# Patient Record
Sex: Male | Born: 1960 | Race: White | Hispanic: No | Marital: Married | Smoking: Never smoker
Health system: Southern US, Community
[De-identification: ages and names within clinical notes are randomized; demographics above are authoritative.]

## PROBLEM LIST (undated history)

## (undated) DIAGNOSIS — N400 Enlarged prostate without lower urinary tract symptoms: Secondary | ICD-10-CM

## (undated) DIAGNOSIS — R3912 Poor urinary stream: Secondary | ICD-10-CM

## (undated) DIAGNOSIS — E785 Hyperlipidemia, unspecified: Secondary | ICD-10-CM

## (undated) DIAGNOSIS — R972 Elevated prostate specific antigen [PSA]: Secondary | ICD-10-CM

## (undated) DIAGNOSIS — R3914 Feeling of incomplete bladder emptying: Secondary | ICD-10-CM

## (undated) DIAGNOSIS — I10 Essential (primary) hypertension: Secondary | ICD-10-CM

## (undated) DIAGNOSIS — N320 Bladder-neck obstruction: Secondary | ICD-10-CM

## (undated) DIAGNOSIS — E119 Type 2 diabetes mellitus without complications: Secondary | ICD-10-CM

## (undated) HISTORY — DX: Feeling of incomplete bladder emptying: R39.14

## (undated) HISTORY — DX: Elevated prostate specific antigen (PSA): R97.20

## (undated) HISTORY — DX: Poor urinary stream: R39.12

---

## 2015-08-05 HISTORY — PX: CIRCUMCISION: SUR203

## 2016-03-15 ENCOUNTER — Encounter (HOSPITAL_COMMUNITY): Payer: Self-pay | Admitting: Emergency Medicine

## 2016-03-15 ENCOUNTER — Emergency Department (HOSPITAL_COMMUNITY): Payer: 59

## 2016-03-15 ENCOUNTER — Emergency Department (HOSPITAL_COMMUNITY)
Admission: EM | Admit: 2016-03-15 | Discharge: 2016-03-15 | Disposition: A | Discharge status: Home / Self Care | Payer: 59 | Attending: Emergency Medicine | Admitting: Emergency Medicine

## 2016-03-15 DIAGNOSIS — E119 Type 2 diabetes mellitus without complications: Secondary | ICD-10-CM | POA: Diagnosis not present

## 2016-03-15 DIAGNOSIS — J069 Acute upper respiratory infection, unspecified: Secondary | ICD-10-CM | POA: Diagnosis not present

## 2016-03-15 DIAGNOSIS — J988 Other specified respiratory disorders: Secondary | ICD-10-CM

## 2016-03-15 DIAGNOSIS — R69 Illness, unspecified: Secondary | ICD-10-CM | POA: Diagnosis present

## 2016-03-15 DIAGNOSIS — B9789 Other viral agents as the cause of diseases classified elsewhere: Secondary | ICD-10-CM

## 2016-03-15 DIAGNOSIS — I10 Essential (primary) hypertension: Secondary | ICD-10-CM | POA: Insufficient documentation

## 2016-03-15 HISTORY — DX: Essential (primary) hypertension: I10

## 2016-03-15 LAB — URINALYSIS, ROUTINE W REFLEX MICROSCOPIC
BILIRUBIN URINE: NEGATIVE
Glucose, UA: >1000 mg/dL — AB
KETONES UR: 15 mg/dL — AB
Leukocytes, UA: NEGATIVE
NITRITE: NEGATIVE
Protein, ur: NEGATIVE mg/dL
SPECIFIC GRAVITY, URINE: 1.01 (ref 1.005–1.030)
pH: 6 (ref 5.0–8.0)

## 2016-03-15 LAB — BASIC METABOLIC PANEL
Anion gap: 6 (ref 5–15)
BUN: 18 mg/dL (ref 6–20)
CHLORIDE: 104 mmol/L (ref 101–111)
CO2: 21 mmol/L — ABNORMAL LOW (ref 22–32)
Calcium: 8.2 mg/dL — ABNORMAL LOW (ref 8.9–10.3)
Creatinine, Ser: 0.92 mg/dL (ref 0.61–1.24)
GLUCOSE: 199 mg/dL — AB (ref 65–99)
POTASSIUM: 3.7 mmol/L (ref 3.5–5.1)
SODIUM: 131 mmol/L — AB (ref 135–145)

## 2016-03-15 LAB — CBC WITH DIFFERENTIAL/PLATELET
BASOS PCT: 0 %
Basophils Absolute: 0 10*3/uL (ref 0.0–0.1)
EOS ABS: 0 10*3/uL (ref 0.0–0.7)
EOS PCT: 0 %
HCT: 44.1 % (ref 39.0–52.0)
Hemoglobin: 15.1 g/dL (ref 13.0–17.0)
LYMPHS ABS: 0.6 10*3/uL — AB (ref 0.7–4.0)
Lymphocytes Relative: 10 %
MCH: 29 pg (ref 26.0–34.0)
MCHC: 34.2 g/dL (ref 30.0–36.0)
MCV: 84.8 fL (ref 78.0–100.0)
Monocytes Absolute: 0.5 10*3/uL (ref 0.1–1.0)
Monocytes Relative: 8 %
NEUTROS PCT: 83 %
Neutro Abs: 5 10*3/uL (ref 1.7–7.7)
PLATELETS: 109 10*3/uL — AB (ref 150–400)
RBC: 5.2 MIL/uL (ref 4.22–5.81)
RDW: 13.3 % (ref 11.5–15.5)
WBC: 6 10*3/uL (ref 4.0–10.5)

## 2016-03-15 LAB — LACTIC ACID, PLASMA
LACTIC ACID, VENOUS: 1.3 mmol/L (ref 0.5–1.9)
Lactic Acid, Venous: 2.2 mmol/L (ref 0.5–1.9)

## 2016-03-15 LAB — RAPID STREP SCREEN (MED CTR MEBANE ONLY): Streptococcus, Group A Screen (Direct): NEGATIVE

## 2016-03-15 LAB — URINE MICROSCOPIC-ADD ON
BACTERIA UA: NONE SEEN
WBC, UA: NONE SEEN WBC/hpf (ref 0–5)

## 2016-03-15 MED ORDER — SODIUM CHLORIDE 0.9 % IV SOLN
1000.0000 mL | INTRAVENOUS | Status: DC
  Administered 2016-03-15: 1000 mL via INTRAVENOUS

## 2016-03-15 MED ORDER — ACETAMINOPHEN 500 MG PO TABS
1000.0000 mg | ORAL_TABLET | Freq: Once | ORAL | Status: AC
  Administered 2016-03-15: 1000 mg via ORAL

## 2016-03-15 MED ORDER — SODIUM CHLORIDE 0.9 % IV SOLN
1000.0000 mL | Freq: Once | INTRAVENOUS | Status: AC
  Administered 2016-03-15: 1000 mL via INTRAVENOUS

## 2016-03-15 MED ORDER — ACETAMINOPHEN 500 MG PO TABS
ORAL_TABLET | ORAL | Status: AC
  Filled 2016-03-15: qty 2

## 2016-03-15 NOTE — ED Provider Notes (Signed)
[facility] DEPT Provider Note   CSN: [id number] Arrival date & time: 03/15/16  1643  First Provider Contact:  None       History   Chief Complaint Chief Complaint  Patient presents with  . Illness    HPI Bradley Hurst is a 55 y.o. male.  Patient is a 55 year old male who presents to the emergency department with a complaint of not feeling well.  The patient states he was in his usual state of good health until yesterday he began to not feel well. Today he continued to not feel well with body aches and fever. He went to an urgent care in [facility]. He was told that he had a lot of sugar in his urine. They did not find a urinary tract infection. The patient was advised to go to a local emergency department for additional testing and evaluation. Patient complains of some nasal congestion, sneezing, sore throat, mild cough. He denies any abdominal pain, back pain, hot joints, or open wounds that are not healing. He states that today he noticed a rash on his belly, but this seems to be resolving now. He's not had any recent injury. There's been no recent operations or procedures reported. Nothing seems to make his symptoms any worse. Tylenol helps him for a short time.   The history is provided by the patient.  Illness  Pertinent negatives include no chest pain, no abdominal pain and no shortness of breath.    Past Medical History:  Diagnosis Date  . Diabetes mellitus without complication ([place])   . Hypertension     There are no active problems to display for this patient.   History reviewed. No pertinent surgical history.     Home Medications    Prior to Admission medications   Not on File    Family History No family history on file.  Social History Social History  Substance Use Topics  . Smoking status: Never Smoker  . Smokeless tobacco: Never Used  . Alcohol use No     Allergies   Review of patient's allergies indicates no known  allergies.   Review of Systems Review of Systems  Constitutional: Positive for chills, fatigue and fever. Negative for activity change.       All ROS Neg except as noted in HPI  HENT: Positive for congestion and sore throat. Negative for nosebleeds and trouble swallowing.   Eyes: Negative for photophobia and discharge.  Respiratory: Positive for cough. Negative for shortness of breath and wheezing.   Cardiovascular: Negative for chest pain and palpitations.  Gastrointestinal: Negative for abdominal pain, blood in stool, diarrhea, nausea and vomiting.  Genitourinary: Positive for dysuria. Negative for frequency and hematuria.  Musculoskeletal: Positive for arthralgias and myalgias. Negative for back pain and neck pain.  Skin: Negative.   Neurological: Negative for dizziness, seizures and speech difficulty.  Psychiatric/Behavioral: Negative for confusion and hallucinations.     Physical Exam Updated Vital Signs BP 119/75 (BP Location: Left Arm)   Pulse 118   Temp 102.8 F (39.3 C) (Oral)   Resp 18   Ht 5\' 10"  (1.778 m)   Wt 131.1 kg   SpO2 96%   BMI 41.47 kg/m   Physical Exam  Constitutional: He is oriented to person, place, and time. He appears well-developed and well-nourished.  Non-toxic appearance.  HENT:  Head: Normocephalic.  Right Ear: Tympanic membrane and external ear normal.  Left Ear: Tympanic membrane and external ear normal.  Nasal congestion Mild increase redness of  the posterior pharynx.  Eyes: EOM and lids are normal. Pupils are equal, round, and reactive to light.  Neck: Normal range of motion. Neck supple. Carotid bruit is not present.  Cardiovascular: Regular rhythm, normal heart sounds, intact distal pulses and normal pulses.  Tachycardia present.   Pulmonary/Chest: Breath sounds normal. No respiratory distress.  Few scattered rhonchi at the bases. Normal respiratory effort. Symmetrical rise and fall of the chest. Patient speaks in complete sentences  without problem.  Abdominal: Soft. Bowel sounds are normal. There is no tenderness. There is no guarding.  Musculoskeletal: Normal range of motion. He exhibits edema.  2+ pitting edema of bilat lower extremities.  Lymphadenopathy:       Head (right side): No submandibular adenopathy present.       Head (left side): No submandibular adenopathy present.    He has no cervical adenopathy.  Neurological: He is alert and oriented to person, place, and time. He has normal strength. No cranial nerve deficit or sensory deficit.  Skin: Skin is warm and dry.  Psychiatric: He has a normal mood and affect. His speech is normal.  Nursing note and vitals reviewed.    ED Treatments / Results  Labs (all labs ordered are listed, but only abnormal results are displayed) Labs Reviewed  URINALYSIS, ROUTINE W REFLEX MICROSCOPIC (NOT AT [facility]) - Abnormal; Notable for the following:       Result Value   Glucose, UA >1000 (*)    Hgb urine dipstick TRACE (*)    Ketones, ur 15 (*)    All other components within normal limits  CBC WITH DIFFERENTIAL/PLATELET - Abnormal; Notable for the following:    Platelets 109 (*)    Lymphs Abs 0.6 (*)    All other components within normal limits  URINE MICROSCOPIC-ADD ON - Abnormal; Notable for the following:    Squamous Epithelial / LPF 0-5 (*)    All other components within normal limits  BASIC METABOLIC PANEL  LACTIC ACID, PLASMA    EKG  EKG Interpretation None       Radiology Dg Chest 2 View  Result Date: 03/15/2016 CLINICAL DATA:  Fever, malaise, SOB since Tuesday EXAM: CHEST  2 VIEW COMPARISON:  None. FINDINGS: The heart size and mediastinal contours are within normal limits. Both lungs are clear. No pleural effusion or pneumothorax. The visualized skeletal structures are unremarkable. IMPRESSION: No active cardiopulmonary disease. Electronically Signed   By: Lajean Manes M.D.   On: 03/15/2016 17:10    Procedures Procedures (including critical care  time)  Medications Ordered in ED Medications  acetaminophen (TYLENOL) 500 MG tablet (not administered)  0.9 %  sodium chloride infusion (not administered)    Followed by  0.9 %  sodium chloride infusion (not administered)  acetaminophen (TYLENOL) tablet 1,000 mg (1,000 mg Oral Given 03/15/16 1730)     Initial Impression / Assessment and Plan / ED Course  Temperature in the emergency department was 102.8. 1 g of Tylenol given. Hydration is elevated at 118. The patient is started on a liter of saline. Temperature down to 101.3 after Tylenol.  Case discussed in detail with Dr. Sabra Heck. Plan for second liter of fluids and observing in the emergency department. After second liter of saline, the temperature is down to 100.6.   I have reviewed the triage vital signs and the nursing notes.  Pertinent labs & imaging results that were available during my care of the patient were reviewed by me and considered in my medical decision making (  see chart for details).  Clinical Course    *I have reviewed nursing notes, vital signs, and all appropriate lab and imaging results for this patient.**  Final Clinical Impressions(s) / ED Diagnoses  Urinalysis shows a clear yellow specimen with a specific gravity 1.010. There is greater than 1000 mg/daL of glucose. There is a trace of hemoglobin present. This 15 mg/daL of ketones present. Nitrates and leukocyte esterase are negative. The basic metabolic panel shows the sodium to be slightly low at 131, CO2 is low at 21, and the glucose is elevated at 199. The complete blood count is within normal limits with exception of the platelets being slightly low at 109,000.  Lactic acid is elevated at 2.2. After second liter of IV fluid, the lactic acid is down to 1.3. The patient states he feels much improved. He is drinking here in the emergency department without problem.  Suspect a viral illness in the face of diabetes. The patient will be discharged home with  instructions to maintain good hydration. He will be asked to use Tylenol every 4 hours, or ibuprofen every 6 hours. He will be asked to return to the verge department after 4:00 on tomorrow August 13 for recheck on. He will return sooner than that if any unusual changes, problems, or concerns. The patient is in agreement with this discharge plan.    Final diagnoses:  None    New Prescriptions New Prescriptions   No medications on file     Lily Kocher, Hershal Coria 03/15/16 2053    Noemi Chapel, MD 03/16/16 [phone]

## 2016-03-15 NOTE — ED Notes (Signed)
Pt reports that he feels much better than when he arrived - He is conversant and reports that he has a goal with his physician to try to keep his Bld sugars in the 140s but does not always check his bld sugar. He has been experiencing increased stress due to a brother's illness and hospitalization in [place]

## 2016-03-15 NOTE — ED Notes (Signed)
Bradley Hurst notified of lactic acid 2.2

## 2016-03-15 NOTE — Discharge Instructions (Signed)
Please use Tylenol every 4 hours, or ibuprofen every 6 hours over the next 3 days. After that, use the Tylenol every 4 hours as needed for fever or aching. Please increase water, Gatorade, sugar-free popsicles, etc. to maintain good hydration. Please return to the emergency department after 4:00 on August 13 for recheck.

## 2016-03-15 NOTE — ED Triage Notes (Addendum)
Pt reports general malaise, cough,  rash on abdomen, and urinary frequency/dysuria since Wed. Pt had Motrin at 0800 this morning.

## 2016-03-16 ENCOUNTER — Inpatient Hospital Stay (HOSPITAL_COMMUNITY)
Admission: EM | Admit: 2016-03-16 | Discharge: 2016-03-20 | DRG: 872 | Disposition: A | Discharge status: Home / Self Care | Payer: 59 | Attending: Internal Medicine | Admitting: Internal Medicine

## 2016-03-16 ENCOUNTER — Encounter (HOSPITAL_COMMUNITY): Payer: Self-pay | Admitting: Emergency Medicine

## 2016-03-16 DIAGNOSIS — D696 Thrombocytopenia, unspecified: Secondary | ICD-10-CM

## 2016-03-16 DIAGNOSIS — R7989 Other specified abnormal findings of blood chemistry: Secondary | ICD-10-CM | POA: Diagnosis not present

## 2016-03-16 DIAGNOSIS — E871 Hypo-osmolality and hyponatremia: Secondary | ICD-10-CM | POA: Diagnosis present

## 2016-03-16 DIAGNOSIS — R945 Abnormal results of liver function studies: Secondary | ICD-10-CM

## 2016-03-16 DIAGNOSIS — D72810 Lymphocytopenia: Secondary | ICD-10-CM | POA: Diagnosis present

## 2016-03-16 DIAGNOSIS — R21 Rash and other nonspecific skin eruption: Secondary | ICD-10-CM | POA: Diagnosis present

## 2016-03-16 DIAGNOSIS — D72819 Decreased white blood cell count, unspecified: Secondary | ICD-10-CM | POA: Diagnosis present

## 2016-03-16 DIAGNOSIS — I10 Essential (primary) hypertension: Secondary | ICD-10-CM | POA: Diagnosis not present

## 2016-03-16 DIAGNOSIS — Z7984 Long term (current) use of oral hypoglycemic drugs: Secondary | ICD-10-CM

## 2016-03-16 DIAGNOSIS — E119 Type 2 diabetes mellitus without complications: Secondary | ICD-10-CM | POA: Diagnosis not present

## 2016-03-16 DIAGNOSIS — A419 Sepsis, unspecified organism: Secondary | ICD-10-CM | POA: Diagnosis not present

## 2016-03-16 DIAGNOSIS — Z79899 Other long term (current) drug therapy: Secondary | ICD-10-CM

## 2016-03-16 DIAGNOSIS — E860 Dehydration: Secondary | ICD-10-CM | POA: Diagnosis not present

## 2016-03-16 DIAGNOSIS — R509 Fever, unspecified: Secondary | ICD-10-CM

## 2016-03-16 DIAGNOSIS — E872 Acidosis: Secondary | ICD-10-CM | POA: Diagnosis not present

## 2016-03-16 LAB — CBC WITH DIFFERENTIAL/PLATELET
BASOS PCT: 0 %
Basophils Absolute: 0 10*3/uL (ref 0.0–0.1)
EOS ABS: 0 10*3/uL (ref 0.0–0.7)
EOS PCT: 0 %
HCT: 43.6 % (ref 39.0–52.0)
HEMOGLOBIN: 14.6 g/dL (ref 13.0–17.0)
Lymphocytes Relative: 8 %
Lymphs Abs: 0.3 10*3/uL — ABNORMAL LOW (ref 0.7–4.0)
MCH: 28.6 pg (ref 26.0–34.0)
MCHC: 33.5 g/dL (ref 30.0–36.0)
MCV: 85.5 fL (ref 78.0–100.0)
Monocytes Absolute: 0.2 10*3/uL (ref 0.1–1.0)
Monocytes Relative: 5 %
NEUTROS PCT: 86 %
Neutro Abs: 3.2 10*3/uL (ref 1.7–7.7)
PLATELETS: 86 10*3/uL — AB (ref 150–400)
RBC: 5.1 MIL/uL (ref 4.22–5.81)
RDW: 13.5 % (ref 11.5–15.5)
WBC: 3.7 10*3/uL — AB (ref 4.0–10.5)

## 2016-03-16 LAB — BASIC METABOLIC PANEL
Anion gap: 10 (ref 5–15)
BUN: 18 mg/dL (ref 6–20)
CHLORIDE: 103 mmol/L (ref 101–111)
CO2: 22 mmol/L (ref 22–32)
CREATININE: 0.99 mg/dL (ref 0.61–1.24)
Calcium: 8 mg/dL — ABNORMAL LOW (ref 8.9–10.3)
Glucose, Bld: 159 mg/dL — ABNORMAL HIGH (ref 65–99)
POTASSIUM: 3.7 mmol/L (ref 3.5–5.1)
SODIUM: 135 mmol/L (ref 135–145)

## 2016-03-16 LAB — HEPATIC FUNCTION PANEL
ALBUMIN: 3.4 g/dL — AB (ref 3.5–5.0)
ALK PHOS: 60 U/L (ref 38–126)
ALT: 96 U/L — ABNORMAL HIGH (ref 17–63)
AST: 98 U/L — ABNORMAL HIGH (ref 15–41)
BILIRUBIN INDIRECT: 1.5 mg/dL — AB (ref 0.3–0.9)
Bilirubin, Direct: 0.7 mg/dL — ABNORMAL HIGH (ref 0.1–0.5)
TOTAL PROTEIN: 6 g/dL — AB (ref 6.5–8.1)
Total Bilirubin: 2.2 mg/dL — ABNORMAL HIGH (ref 0.3–1.2)

## 2016-03-16 LAB — I-STAT CG4 LACTIC ACID, ED
LACTIC ACID, VENOUS: 2.19 mmol/L — AB (ref 0.5–1.9)
LACTIC ACID, VENOUS: 2.15 mmol/L — AB (ref 0.5–1.9)

## 2016-03-16 LAB — APTT: APTT: 37 s — AB (ref 24–36)

## 2016-03-16 LAB — PROTIME-INR
INR: 1.16
PROTHROMBIN TIME: 14.9 s (ref 11.4–15.2)

## 2016-03-16 MED ORDER — SODIUM CHLORIDE 0.9 % IV SOLN
INTRAVENOUS | Status: DC
  Administered 2016-03-16: 23:00:00 via INTRAVENOUS

## 2016-03-16 MED ORDER — VANCOMYCIN HCL IN DEXTROSE 1-5 GM/200ML-% IV SOLN: 1000.0000 mg | Freq: Once | INTRAVENOUS | Status: DC

## 2016-03-16 MED ORDER — PIPERACILLIN-TAZOBACTAM 3.375 G IVPB 30 MIN
3.3750 g | Freq: Once | INTRAVENOUS | Status: AC
  Administered 2016-03-16: 3.375 g via INTRAVENOUS
  Filled 2016-03-16 (×2): qty 50

## 2016-03-16 MED ORDER — ONDANSETRON HCL 4 MG/2ML IJ SOLN: 4.0000 mg | Freq: Four times a day (QID) | INTRAMUSCULAR | Status: DC | PRN

## 2016-03-16 MED ORDER — BISACODYL 5 MG PO TBEC: 5.0000 mg | DELAYED_RELEASE_TABLET | Freq: Every day | ORAL | Status: DC | PRN

## 2016-03-16 MED ORDER — ACETAMINOPHEN 500 MG PO TABS
1000.0000 mg | ORAL_TABLET | Freq: Once | ORAL | Status: AC
  Administered 2016-03-16: 1000 mg via ORAL
  Filled 2016-03-16: qty 2

## 2016-03-16 MED ORDER — PIPERACILLIN-TAZOBACTAM 3.375 G IVPB
3.3750 g | Freq: Three times a day (TID) | INTRAVENOUS | Status: DC
  Administered 2016-03-17 (×2): 3.375 g via INTRAVENOUS
  Filled 2016-03-16 (×2): qty 50

## 2016-03-16 MED ORDER — SODIUM CHLORIDE 0.9 % IV BOLUS (SEPSIS)
1000.0000 mL | Freq: Once | INTRAVENOUS | Status: AC
  Administered 2016-03-16: 1000 mL via INTRAVENOUS

## 2016-03-16 MED ORDER — IBUPROFEN 800 MG PO TABS
800.0000 mg | ORAL_TABLET | Freq: Once | ORAL | Status: AC
  Administered 2016-03-16: 800 mg via ORAL
  Filled 2016-03-16: qty 1

## 2016-03-16 MED ORDER — VANCOMYCIN HCL IN DEXTROSE 1-5 GM/200ML-% IV SOLN
2000.0000 mg | Freq: Once | INTRAVENOUS | Status: AC
  Administered 2016-03-16: 2000 mg via INTRAVENOUS
  Filled 2016-03-16: qty 400

## 2016-03-16 MED ORDER — DOXYCYCLINE HYCLATE 100 MG IV SOLR
200.0000 mg | Freq: Two times a day (BID) | INTRAVENOUS | Status: DC
  Administered 2016-03-17 – 2016-03-19 (×7): 200 mg via INTRAVENOUS
  Filled 2016-03-16 (×10): qty 200

## 2016-03-16 MED ORDER — HYDROCODONE-ACETAMINOPHEN 5-325 MG PO TABS
1.0000 | ORAL_TABLET | ORAL | Status: DC | PRN
  Administered 2016-03-18 – 2016-03-19 (×2): 2 via ORAL
  Filled 2016-03-16 (×2): qty 2

## 2016-03-16 MED ORDER — INSULIN ASPART 100 UNIT/ML ~~LOC~~ SOLN
0.0000 [IU] | Freq: Three times a day (TID) | SUBCUTANEOUS | Status: DC
  Administered 2016-03-17 – 2016-03-18 (×4): 3 [IU] via SUBCUTANEOUS
  Administered 2016-03-18: 2 [IU] via SUBCUTANEOUS
  Administered 2016-03-18: 5 [IU] via SUBCUTANEOUS
  Administered 2016-03-19: 3 [IU] via SUBCUTANEOUS
  Administered 2016-03-19: 5 [IU] via SUBCUTANEOUS
  Administered 2016-03-19 – 2016-03-20 (×2): 3 [IU] via SUBCUTANEOUS

## 2016-03-16 MED ORDER — OMEGA-3-ACID ETHYL ESTERS 1 G PO CAPS
1.0000 g | ORAL_CAPSULE | Freq: Two times a day (BID) | ORAL | Status: DC
  Administered 2016-03-16 – 2016-03-20 (×8): 1 g via ORAL
  Filled 2016-03-16 (×8): qty 1

## 2016-03-16 MED ORDER — POTASSIUM CHLORIDE CRYS ER 10 MEQ PO TBCR
10.0000 meq | EXTENDED_RELEASE_TABLET | Freq: Every day | ORAL | Status: DC
  Administered 2016-03-16 – 2016-03-19 (×4): 10 meq via ORAL
  Filled 2016-03-16 (×4): qty 1

## 2016-03-16 MED ORDER — ACETAMINOPHEN 325 MG PO TABS
650.0000 mg | ORAL_TABLET | Freq: Four times a day (QID) | ORAL | Status: DC | PRN
  Administered 2016-03-17 – 2016-03-20 (×3): 650 mg via ORAL
  Filled 2016-03-16 (×3): qty 2

## 2016-03-16 MED ORDER — ONDANSETRON HCL 4 MG PO TABS
4.0000 mg | ORAL_TABLET | Freq: Four times a day (QID) | ORAL | Status: DC | PRN
Start: 2016-03-16 — End: 2016-03-20

## 2016-03-16 MED ORDER — TAMSULOSIN HCL 0.4 MG PO CAPS
0.4000 mg | ORAL_CAPSULE | Freq: Two times a day (BID) | ORAL | Status: DC
  Administered 2016-03-16 – 2016-03-20 (×8): 0.4 mg via ORAL
  Filled 2016-03-16 (×8): qty 1

## 2016-03-16 MED ORDER — VANCOMYCIN HCL 10 G IV SOLR
1250.0000 mg | Freq: Three times a day (TID) | INTRAVENOUS | Status: DC
  Filled 2016-03-16: qty 1250

## 2016-03-16 MED ORDER — ACETAMINOPHEN 650 MG RE SUPP: 650.0000 mg | Freq: Four times a day (QID) | RECTAL | Status: DC | PRN

## 2016-03-16 MED ORDER — POLYETHYLENE GLYCOL 3350 17 G PO PACK: 17.0000 g | PACK | Freq: Every day | ORAL | Status: DC | PRN

## 2016-03-16 NOTE — ED Notes (Signed)
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

## 2016-03-16 NOTE — ED Provider Notes (Addendum)
[facility] DEPT Provider Note   CSN: [id number] Arrival date & time: 03/16/16  1623  First Provider Contact:  None       History   Chief Complaint Chief Complaint  Patient presents with  . Abnormal Lab    HPI Bradley Hurst is a 55 y.o. male.  Patient was evaluated here yesterday for dehydration, elevated lactate, general malaise. Strep test, chest x-ray, urinalysis all negative at that time. 3 L of IV fluids were given. Patient felt better at discharge and his lactate had decreased. He had sweats last night but felt better during the day today. Now he feels slightly chilled. No neurological deficits, stiff neck, cough, dysuria.  He says he feels better than yesterday.      Past Medical History:  Diagnosis Date  . Diabetes mellitus without complication ([place])   . Hypertension     There are no active problems to display for this patient.   History reviewed. No pertinent surgical history.     Home Medications    Prior to Admission medications   Not on File    Family History History reviewed. No pertinent family history.  Social History Social History  Substance Use Topics  . Smoking status: Never Smoker  . Smokeless tobacco: Never Used  . Alcohol use No     Allergies   Review of patient's allergies indicates no known allergies.   Review of Systems Review of Systems  All other systems reviewed and are negative.    Physical Exam Updated Vital Signs BP 129/64 (BP Location: Left Arm)   Pulse 102   Temp 98.7 F (37.1 C) (Oral)   Resp 16   Ht 5\' 10"  (1.778 m)   Wt 289 lb (131.1 kg)   SpO2 97%   BMI 41.47 kg/m   Physical Exam  Constitutional:  Obese, no acute distress  HENT:  Head: Normocephalic and atraumatic.  Eyes: Conjunctivae are normal.  Neck: Neck supple.  Cardiovascular: Normal rate and regular rhythm.   No murmur heard. Pulmonary/Chest: Effort normal and breath sounds normal. No respiratory distress.  Abdominal: Soft. There is  no tenderness.  Musculoskeletal: He exhibits no edema.  Neurological: He is alert.  Skin: Skin is warm and dry.  Psychiatric: He has a normal mood and affect.  Nursing note and vitals reviewed.    ED Treatments / Results  Labs (all labs ordered are listed, but only abnormal results are displayed) Labs Reviewed  CBC WITH DIFFERENTIAL/PLATELET - Abnormal; Notable for the following:       Result Value   WBC 3.7 (*)    Platelets 86 (*)    Lymphs Abs 0.3 (*)    All other components within normal limits  BASIC METABOLIC PANEL - Abnormal; Notable for the following:    Glucose, Bld 159 (*)    Calcium 8.0 (*)    All other components within normal limits  I-STAT CG4 LACTIC ACID, ED - Abnormal; Notable for the following:    Lactic Acid, Venous 2.19 (*)    All other components within normal limits  CULTURE, BLOOD (ROUTINE X 2)  CULTURE, BLOOD (ROUTINE X 2)  I-STAT CG4 LACTIC ACID, ED    EKG  EKG Interpretation None       Radiology Dg Chest 2 View  Result Date: 03/15/2016 CLINICAL DATA:  Fever, malaise, SOB since Tuesday EXAM: CHEST  2 VIEW COMPARISON:  None. FINDINGS: The heart size and mediastinal contours are within normal limits. Both lungs are clear. No pleural effusion  or pneumothorax. The visualized skeletal structures are unremarkable. IMPRESSION: No active cardiopulmonary disease. Electronically Signed   By: Lajean Manes M.D.   On: 03/15/2016 17:10    Procedures Procedures (including critical care time)  Medications Ordered in ED Medications  sodium chloride 0.9 % bolus 1,000 mL (not administered)  sodium chloride 0.9 % bolus 1,000 mL (1,000 mLs Intravenous New Bag/Given 03/16/16 1727)     Initial Impression / Assessment and Plan / ED Course  I have reviewed the triage vital signs and the nursing notes.  Pertinent labs & imaging results that were available during my care of the patient were reviewed by me and considered in my medical decision making (see chart for  details).  Clinical Course    Patient is nontoxic-appearing. Lactate still minimally elevated at 2.19.    1 L of IV fluids given. Blood cultures 2 pending. Discussed test results c patient and his significant other.   Patient rechecked several times. At 2100, he was febrile and diaphoretic. Admit to observation.  Final Clinical Impressions(s) / ED Diagnoses   Final diagnoses:  Lactate blood increase    New Prescriptions New Prescriptions   No medications on file     Nat Christen, MD 03/16/16 Lesia Sago, MD 03/16/16 2117

## 2016-03-16 NOTE — ED Notes (Signed)
Dr Cook in to reassess 

## 2016-03-16 NOTE — H&P (Signed)
History and Physical    Bradley Hurst [id number] DOB: 07-Jun-1961 DOA: 03/16/2016  PCP: [name], MD   Patient coming from: Home   Chief Complaint: Fevers, malaise   HPI: Bradley Hurst is a 55 y.o. male with medical history significant for hypertension and type 2 diabetes mellitus presents to the emergency department for evaluation of fevers and malaise. Patient reports being in his usual state of health until 03/11/2016 when he developed subjective fevers and chills. He felt fine the following day, but the day after woke with subjective fever, malaise, lethargy, and wheezing. The wheezing resolved, but intermittent fevers and general malaise and lethargy persisted. Patient was evaluated in the emergency department yesterday for these complaints and and was noted to have fever, tachycardia, and lactic acid of 2.2. Workup with chest x-ray and urinalysis were not revealing of infectious focus and abdominal exam was benign. Patient was given 3 L of normal saline during that visit and lactic acid normalized. He was then discharged home with instructions to return for repeat lactic acid. Since returning home, he is continued to have intermittent fevers and malaise and returns today for recheck of lactic acid. There has been no interval development of dyspnea, cough, abdominal pain, dysuria, flank pain, headache, neck stiffness, ear pain, or sore throat. There was reportedly a maculopapular rash overlying his abdomen noted in the ED yesterday.   ED Course: Upon arrival to the ED, patient is found to be febrile to 39.3 C, saturating adequately on room air, tachycardic low 100s, and with stable blood pressure. Chemistry panel was unremarkable and CBC is notable for a new leukopenia with WBC of 3700, down from 6000 yesterday. Also noted on CBC is a thrombocytopenia with platelet count of 86,000, down from 109,000 yesterday. There is a lymphocytopenia with absolute lymphocyte count of 300. Lactic acid is  elevated at 2.19 initially, and remains elevated to 2.15 after 2 L of normal saline. A third liter of normal saline was given as a bolus in the emergency department. Blood cultures have been obtained and the patient has been treated symptomatically with Advil and acetaminophen. He will be observed in the hospital for ongoing evaluation and management of sepsis with unknown source.  Review of Systems:  All other systems reviewed and apart from HPI, are negative.  Past Medical History:  Diagnosis Date  . Diabetes mellitus without complication ([place])   . Hypertension     History reviewed. No pertinent surgical history.   reports that he has never smoked. He has never used smokeless tobacco. He reports that he does not drink alcohol or use drugs.  No Known Allergies  History reviewed. No pertinent family history.   Prior to Admission medications   Medication Sig Start Date End Date Taking? Authorizing Provider  amLODipine (NORVASC) 10 MG tablet Take 10 mg by mouth daily.   Yes Historical Provider, MD  empagliflozin (JARDIANCE) 25 MG TABS tablet Take 25 mg by mouth daily.   Yes Historical Provider, MD  losartan (COZAAR) 100 MG tablet Take 100 mg by mouth daily.   Yes Historical Provider, MD  metFORMIN (GLUCOPHAGE) 1000 MG tablet Take 1,000 mg by mouth 2 (two) times daily with a meal.   Yes Historical Provider, MD  omega-3 acid ethyl esters (LOVAZA) 1 g capsule Take 1 g by mouth 2 (two) times daily.   Yes Historical Provider, MD  Potassium Chloride CR (MICRO-K) 8 MEQ CPCR capsule CR Take 8 mEq by mouth at bedtime.   Yes Historical  Provider, MD  sitaGLIPtin (JANUVIA) 100 MG tablet Take 100 mg by mouth daily.   Yes Historical Provider, MD  tamsulosin (FLOMAX) 0.4 MG CAPS capsule Take 0.4 mg by mouth 2 (two) times daily.   Yes Historical Provider, MD    Physical Exam: Vitals:   03/16/16 1841 03/16/16 1923 03/16/16 2010 03/16/16 2303  BP:  129/64 118/59 135/69  Pulse:  102 106 85  Resp:  16  18 18   Temp: 98.8 F (37.1 C) 98.7 F (37.1 C) 102.8 F (39.3 C) 99.6 F (37.6 C)  TempSrc: Oral Oral Oral Oral  SpO2:  97% 95% 96%  Weight:    131.8 kg (290 lb 9.1 oz)  Height:    5\' 9"  (1.753 m)      Constitutional: NAD, calm, sweaty, in apparent discomfort Eyes: PERTLA, lids and conjunctivae normal ENMT: Mucous membranes are moist. Posterior pharynx clear of any exudate or lesions.   Neck: normal, supple, no masses, no thyromegaly Respiratory: clear to auscultation bilaterally, no wheezing, no crackles. Normal respiratory effort.   Cardiovascular: S1 & S2 heard, regular rate and rhythm. 2+ pedal pulses. No carotid bruits. No significant JVD. Abdomen: No distension, no tenderness, no masses palpated. Bowel sounds normal.  Musculoskeletal: no clubbing / cyanosis. No joint deformity upper and lower extremities. Normal muscle tone.  Skin: Faint erythematous macules overlie abdomen diffusely. Warm, dry, well-perfused. Neurologic: CN 2-12 grossly intact. Sensation intact, DTR normal. Strength 5/5 in all 4 limbs.  Psychiatric: Normal judgment and insight. Alert and oriented x 3. Normal mood and affect.     Labs on Admission: I have personally reviewed following labs and imaging studies  CBC:  Recent Labs Lab 03/15/16 1719 03/16/16 1740  WBC 6.0 3.7*  NEUTROABS 5.0 3.2  HGB 15.1 14.6  HCT 44.1 43.6  MCV 84.8 85.5  PLT 109* 86*   Basic Metabolic Panel:  Recent Labs Lab 03/15/16 1719 03/16/16 1740  NA 131* 135  K 3.7 3.7  CL 104 103  CO2 21* 22  GLUCOSE 199* 159*  BUN 18 18  CREATININE 0.92 0.99  CALCIUM 8.2* 8.0*   GFR: Estimated Creatinine Clearance: 113.4 mL/min (by C-G formula based on SCr of 0.99 mg/dL). Liver Function Tests: No results for input(s): AST, ALT, ALKPHOS, BILITOT, PROT, ALBUMIN in the last 168 hours. No results for input(s): LIPASE, AMYLASE in the last 168 hours. No results for input(s): AMMONIA in the last 168 hours. Coagulation  Profile: No results for input(s): INR, PROTIME in the last 168 hours. Cardiac Enzymes: No results for input(s): CKTOTAL, CKMB, CKMBINDEX, TROPONINI in the last 168 hours. BNP (last 3 results) No results for input(s): PROBNP in the last 8760 hours. HbA1C: No results for input(s): HGBA1C in the last 72 hours. CBG: No results for input(s): GLUCAP in the last 168 hours. Lipid Profile: No results for input(s): CHOL, HDL, LDLCALC, TRIG, CHOLHDL, LDLDIRECT in the last 72 hours. Thyroid Function Tests: No results for input(s): TSH, T4TOTAL, FREET4, T3FREE, THYROIDAB in the last 72 hours. Anemia Panel: No results for input(s): VITAMINB12, FOLATE, FERRITIN, TIBC, IRON, RETICCTPCT in the last 72 hours. Urine analysis:    Component Value Date/Time   COLORURINE YELLOW 03/15/2016 1657   APPEARANCEUR CLEAR 03/15/2016 1657   LABSPEC 1.010 03/15/2016 1657   PHURINE 6.0 03/15/2016 1657   GLUCOSEU >1000 (A) 03/15/2016 1657   HGBUR TRACE (A) 03/15/2016 1657   BILIRUBINUR NEGATIVE 03/15/2016 1657   KETONESUR 15 (A) 03/15/2016 [place] 03/15/2016 1657  NITRITE NEGATIVE 03/15/2016 1657   LEUKOCYTESUR NEGATIVE 03/15/2016 1657   Sepsis Labs: @LABRCNTIP (procalcitonin:4,lacticidven:4) ) Recent Results (from the past 240 hour(s))  Rapid strep screen     Status: None   Collection Time: 03/15/16  6:45 PM  Result Value Ref Range Status   Streptococcus, Group A Screen (Direct) NEGATIVE NEGATIVE Final    Comment: (NOTE) A Rapid Antigen test may result negative if the antigen level in the sample is below the detection level of this test. The FDA has not cleared this test as a stand-alone test therefore the rapid antigen negative result has reflexed to a Group A Strep culture.   Culture, group A strep     Status: None (Preliminary result)   Collection Time: 03/15/16  6:45 PM  Result Value Ref Range Status   Specimen Description THROAT  Final   Special Requests NONE Reflexed from  [id number]  Final   Culture   Final    TOO YOUNG TO READ Performed at [facility]    Report Status PENDING  Incomplete     Radiological Exams on Admission: Dg Chest 2 View  Result Date: 03/15/2016 CLINICAL DATA:  Fever, malaise, SOB since Tuesday EXAM: CHEST  2 VIEW COMPARISON:  None. FINDINGS: The heart size and mediastinal contours are within normal limits. Both lungs are clear. No pleural effusion or pneumothorax. The visualized skeletal structures are unremarkable. IMPRESSION: No active cardiopulmonary disease. Electronically Signed   By: Lajean Manes M.D.   On: 03/15/2016 17:10    EKG: Ordered and pending.   Assessment/Plan  1. Sepsis with unknown source  - Pt presents with fever, tachycardia, elevated lactate, leukopenia  - Illness began 8/8 with fever/chills and malaise, abated for a day, and has persisted since  - Reports insect bite on back ~1 month ago, possibly a tick, states his significant other "removed it"  - Had normal WBC in ED on 8/12, and is now leukopenic  - Blood and urine cultures incubating  - 30 cc/kg NS bolus given  - Given his apparent worsening over the past day without treatment, empiric abx initiated with vancomycin and Zosyn - There is some concern for RMSF or other tick-borne illness with his rash and thrombocytopenia; RMSF serologies ordered and empiric doxy started  - Trend lactate and PCT; add LFT's to admission BMP --> elevated, will check RUQ [place]  2. Dehydration  - Given 3 L NS bolus in ED - Continued on NS at 150 cc/hr while repeat lactate pending  - If lactate has normalized and pt remaining hemodynamically stable, will slow fluids    3. Leukopenia, thrombocytopenia - WBC 3,700 on admission with absolute lymphocyte count of 300 - Primary concern is for sepsis and this is being managed as above  - Check HIV, ordered   - Platelets 86,000 on admission, down from 109,000 the day prior  - Uncertain etiology, concerned for sepsis and  possible tick-borne illness as above  - No s/s bleed and no petechial rash noted  - VTE ppx with SCD's only for now   4. Type II DM  - No A1c on file  - Managed with Jardiance, metformin, and Januvia at home; will hold these while in hospital  - Check CBG with meals and qHS  - Moderate-intensity SSI correctional to start; adjust prn   5. Hypertension - Running on the low side in the ED  - Managed at home with losartan, currently held in setting of suspected sepsis  - Resume treatment  as appropriate     DVT prophylaxis: SCD's  Code Status: Full  Family Communication: Discussed with patient Disposition Plan: Observe on med-surg Consults called: None  Admission status: Observation     Vianne Bulls, MD [facility] Pager [phone]  If 7PM-7AM, please contact night-coverage [email or web address] Password [facility]  03/16/2016, 11:34 PM

## 2016-03-16 NOTE — ED Notes (Signed)
Phlebotomy at bedside.

## 2016-03-16 NOTE — Discharge Instructions (Signed)
Increase fluids. Tylenol for fever. We have drawn 2 blood cultures. They are pending at this time. Your urinalysis, chest x-ray, strep test from yesterday were all read as negative.  Return here if worse in anyway.

## 2016-03-16 NOTE — ED Notes (Signed)
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

## 2016-03-16 NOTE — ED Notes (Signed)
hospitalist in to assess  

## 2016-03-16 NOTE — ED Notes (Signed)
EDP at bedside  

## 2016-03-16 NOTE — Progress Notes (Signed)
Pharmacy Antibiotic Note  Bradley Hurst is a 55 y.o. male admitted on 03/16/2016 with sepsis.  Pharmacy has been consulted for vancomycin and zosyn dosing.  Plan: Zosyn 3.375 g IV x 1 over 30 min now Zosyn 3.375g IV q8h (4 hour infusion).  Vancomycin 2g IV x1 now Vancomycin 1250 mg IV q8h Check vancomycin trough at [facility] Follow up SCr, UOP, cultures, clinical course and adjust as clinically indicated.    Height: 5\' 9"  (175.3 cm) Weight: 290 lb 9.1 oz (131.8 kg) IBW/kg (Calculated) : 70.7  Temp (24hrs), Avg:99.6 F (37.6 C), Min:97.9 F (36.6 C), Max:102.8 F (39.3 C)   Recent Labs Lab 03/15/16 1719 03/15/16 1951 03/16/16 1658 03/16/16 1740 03/16/16 2041  WBC 6.0  --   --  3.7*  --   CREATININE 0.92  --   --  0.99  --   LATICACIDVEN 2.2* 1.3 2.19*  --  2.15*    Estimated Creatinine Clearance: 113.4 mL/min (by C-G formula based on SCr of 0.99 mg/dL).    No Known Allergies  Antimicrobials this admission: zosyn 8/14 >>  vancomycin 8/14 >>   Dose adjustments this admission:   Microbiology results:  BCx:   UCx:    Sputum:    MRSA PCR:   Thank you for allowing pharmacy to be a part of this patient's care.  Vonda Antigua 03/16/2016 11:37 PM

## 2016-03-16 NOTE — ED Triage Notes (Signed)
Patient seen here in ER yesterday. Patient's lactic acid elevated, given 3 liters of fluid in which lactic acid level decreased. Patient told to come back today to have lactic acid rechecked. Per patient seen yesterday for cough, rash on abd, and general malaise. Patient does reports feeling an improvement since receiving fluids.

## 2016-03-16 NOTE — ED Notes (Signed)
Pt dischrge instructions ready, IV DCd and discharge VS obtained to show tep of 102.8- Dr Lacinda Axon informed and now in room to discuss with pt

## 2016-03-17 ENCOUNTER — Observation Stay (HOSPITAL_COMMUNITY): Payer: 59

## 2016-03-17 DIAGNOSIS — I10 Essential (primary) hypertension: Secondary | ICD-10-CM | POA: Diagnosis not present

## 2016-03-17 DIAGNOSIS — E119 Type 2 diabetes mellitus without complications: Secondary | ICD-10-CM | POA: Diagnosis not present

## 2016-03-17 DIAGNOSIS — R945 Abnormal results of liver function studies: Secondary | ICD-10-CM

## 2016-03-17 DIAGNOSIS — R7989 Other specified abnormal findings of blood chemistry: Secondary | ICD-10-CM | POA: Diagnosis not present

## 2016-03-17 DIAGNOSIS — E860 Dehydration: Secondary | ICD-10-CM | POA: Diagnosis not present

## 2016-03-17 DIAGNOSIS — D696 Thrombocytopenia, unspecified: Secondary | ICD-10-CM | POA: Diagnosis not present

## 2016-03-17 DIAGNOSIS — D72819 Decreased white blood cell count, unspecified: Secondary | ICD-10-CM | POA: Diagnosis not present

## 2016-03-17 LAB — BASIC METABOLIC PANEL
ANION GAP: 10 (ref 5–15)
BUN: 21 mg/dL — ABNORMAL HIGH (ref 6–20)
CALCIUM: 7.7 mg/dL — AB (ref 8.9–10.3)
CO2: 19 mmol/L — ABNORMAL LOW (ref 22–32)
CREATININE: 0.79 mg/dL (ref 0.61–1.24)
Chloride: 104 mmol/L (ref 101–111)
Glucose, Bld: 165 mg/dL — ABNORMAL HIGH (ref 65–99)
Potassium: 3.3 mmol/L — ABNORMAL LOW (ref 3.5–5.1)
SODIUM: 133 mmol/L — AB (ref 135–145)

## 2016-03-17 LAB — CBC
HCT: 38.3 % — ABNORMAL LOW (ref 39.0–52.0)
HEMOGLOBIN: 13.1 g/dL (ref 13.0–17.0)
MCH: 28.9 pg (ref 26.0–34.0)
MCHC: 34.2 g/dL (ref 30.0–36.0)
MCV: 84.5 fL (ref 78.0–100.0)
PLATELETS: 76 10*3/uL — AB (ref 150–400)
RBC: 4.53 MIL/uL (ref 4.22–5.81)
RDW: 13.3 % (ref 11.5–15.5)
WBC: 2.5 10*3/uL — ABNORMAL LOW (ref 4.0–10.5)

## 2016-03-17 LAB — URINALYSIS, ROUTINE W REFLEX MICROSCOPIC
Bilirubin Urine: NEGATIVE
Glucose, UA: >1000 mg/dL — AB
Hgb urine dipstick: NEGATIVE
LEUKOCYTES UA: NEGATIVE
NITRITE: NEGATIVE
PH: 6 (ref 5.0–8.0)
Specific Gravity, Urine: 1.015 (ref 1.005–1.030)

## 2016-03-17 LAB — URINE MICROSCOPIC-ADD ON

## 2016-03-17 LAB — GLUCOSE, CAPILLARY
Glucose-Capillary: 175 mg/dL — ABNORMAL HIGH (ref 65–99)
Glucose-Capillary: 159 mg/dL — ABNORMAL HIGH (ref 65–99)
Glucose-Capillary: 158 mg/dL — ABNORMAL HIGH (ref 65–99)
Glucose-Capillary: 152 mg/dL — ABNORMAL HIGH (ref 65–99)

## 2016-03-17 LAB — PROCALCITONIN: Procalcitonin: 1.65 ng/mL

## 2016-03-17 LAB — LACTIC ACID, PLASMA
LACTIC ACID, VENOUS: 1.3 mmol/L (ref 0.5–1.9)
Lactic Acid, Venous: 1.8 mmol/L (ref 0.5–1.9)

## 2016-03-17 MED ORDER — SODIUM CHLORIDE 0.9 % IV SOLN
INTRAVENOUS | Status: AC
  Administered 2016-03-17: 05:00:00 via INTRAVENOUS

## 2016-03-17 MED ORDER — DOXYCYCLINE HYCLATE 100 MG IV SOLR
INTRAVENOUS | Status: AC
  Filled 2016-03-17: qty 200

## 2016-03-17 MED ORDER — IBUPROFEN 400 MG PO TABS
400.0000 mg | ORAL_TABLET | Freq: Three times a day (TID) | ORAL | Status: DC | PRN
  Administered 2016-03-17 (×2): 400 mg via ORAL
  Filled 2016-03-17 (×2): qty 1

## 2016-03-17 MED ORDER — VANCOMYCIN HCL 10 G IV SOLR
1250.0000 mg | Freq: Two times a day (BID) | INTRAVENOUS | Status: DC
  Filled 2016-03-17 (×2): qty 1250

## 2016-03-17 NOTE — Progress Notes (Signed)
Pt made NPO at this time.  ABD [place] scheduled for this afternoon.

## 2016-03-17 NOTE — Progress Notes (Signed)
PROGRESS NOTE    Bradley Hurst  [id number] DOB: [birth year] DOA: 03/16/2016 PCP: [name], MD     Brief Narrative:  55 y/o man admitted on 8/13 with fever, rash and malaise. Source remains unclear.   Assessment & Plan:   Principal Problem:   Dehydration Active Problems:   Sepsis ([place])   Leukopenia   Thrombocytopenia ([facility])   Essential hypertension   Diabetes mellitus type II, non insulin dependent (H[facility])   Elevated LFTs   Fever/Rash/Malaise -Altho diagnosis is unclear, I am suspicious for RMSF given above as well as lab abnormalities such as hyponatremia, thrombocytopenia, leukopenia and transaminitis. -RMSF and ehrlichia titers have been requested. -Have commenced treatment with doxycycline. -Rash is maculopapular and mainly over torso, but also present on extremities. -patient states that 2 weeks ago his spouse pulled a tick off his back. -Will DC vanc and zosyn  (?what are we treating). -CXR/UA without infection, cx data pending. -Seems to be defervescing with treatment.  DM II -Fair control. -Continue current management.  HTN -Well controlled   DVT prophylaxis: SCDs Code Status: Full Code Family Communication: Patient only Disposition Plan: Home when ready  Consultants:   None  Procedures:   None  Antimicrobials:   Doxycycline    Subjective: Feels much improved today  Objective: Vitals:   03/17/16 0745 03/17/16 0804 03/17/16 0902 03/17/16 1353  BP:    105/69  Pulse:    86  Resp:    20  Temp:  (!) 103 F (39.4 C) 100.2 F (37.9 C) 98.8 F (37.1 C)  TempSrc:  Axillary Oral Oral  SpO2: 90%   97%  Weight:      Height:        Intake/Output Summary (Last 24 hours) at 03/17/16 1820 Last data filed at 03/17/16 1806  Gross per 24 hour  Intake          2758.33 ml  Output             3350 ml  Net          -591.67 ml   Filed Weights   03/16/16 1704 03/16/16 2303 03/17/16 0150  Weight: 131.1 kg (289 lb) 131.8 kg (290 lb 9.1 oz) 131.8  kg (290 lb 9.1 oz)    Examination:   General exam: Alert, awake, oriented x 3 Respiratory system: Clear to auscultation. Respiratory effort normal. Cardiovascular system:RRR. No murmurs, rubs, gallops. Gastrointestinal system: Abdomen is nondistended, soft and nontender. No organomegaly or masses felt. Normal bowel sounds heard. Central nervous system: Alert and oriented. No focal neurological deficits. Extremities: No C/C/E, +pedal pulses Skin: maculopapular rash over torso and extremities Psychiatry: Judgement and insight appear normal. Mood & affect appropriate.     Data Reviewed: I have personally reviewed following labs and imaging studies  CBC:  Recent Labs Lab 03/15/16 1719 03/16/16 1740 03/17/16 0701  WBC 6.0 3.7* 2.5*  NEUTROABS 5.0 3.2  --   HGB 15.1 14.6 13.1  HCT 44.1 43.6 38.3*  MCV 84.8 85.5 84.5  PLT 109* 86* 76*   Basic Metabolic Panel:  Recent Labs Lab 03/15/16 1719 03/16/16 1740 03/17/16 0701  NA 131* 135 133*  K 3.7 3.7 3.3*  CL 104 103 104  CO2 21* 22 19*  GLUCOSE 199* 159* 165*  BUN 18 18 21*  CREATININE 0.92 0.99 0.79  CALCIUM 8.2* 8.0* 7.7*   GFR: Estimated Creatinine Clearance: 140.3 mL/min (by C-G formula based on SCr of 0.8 mg/dL). Liver Function Tests:  Recent Labs  Lab 03/16/16 2328  AST 98*  ALT 96*  ALKPHOS 60  BILITOT 2.2*  PROT 6.0*  ALBUMIN 3.4*   No results for input(s): LIPASE, AMYLASE in the last 168 hours. No results for input(s): AMMONIA in the last 168 hours. Coagulation Profile:  Recent Labs Lab 03/16/16 2328  INR 1.16   Cardiac Enzymes: No results for input(s): CKTOTAL, CKMB, CKMBINDEX, TROPONINI in the last 168 hours. BNP (last 3 results) No results for input(s): PROBNP in the last 8760 hours. HbA1C: No results for input(s): HGBA1C in the last 72 hours. CBG:  Recent Labs Lab 03/17/16 0732 03/17/16 1146 03/17/16 1619  GLUCAP 175* 159* 158*   Lipid Profile: No results for input(s): CHOL,  HDL, LDLCALC, TRIG, CHOLHDL, LDLDIRECT in the last 72 hours. Thyroid Function Tests: No results for input(s): TSH, T4TOTAL, FREET4, T3FREE, THYROIDAB in the last 72 hours. Anemia Panel: No results for input(s): VITAMINB12, FOLATE, FERRITIN, TIBC, IRON, RETICCTPCT in the last 72 hours. Urine analysis:    Component Value Date/Time   COLORURINE YELLOW 03/17/2016 0220   APPEARANCEUR CLEAR 03/17/2016 0220   LABSPEC 1.015 03/17/2016 0220   PHURINE 6.0 03/17/2016 0220   GLUCOSEU >1000 (A) 03/17/2016 0220   HGBUR NEGATIVE 03/17/2016 0220   BILIRUBINUR NEGATIVE 03/17/2016 0220   KETONESUR >80 (A) 03/17/2016 0220   PROTEINUR TRACE (A) 03/17/2016 0220   NITRITE NEGATIVE 03/17/2016 0220   LEUKOCYTESUR NEGATIVE 03/17/2016 0220   Sepsis Labs: @LABRCNTIP (procalcitonin:4,lacticidven:4)  ) Recent Results (from the past 240 hour(s))  Rapid strep screen     Status: None   Collection Time: 03/15/16  6:45 PM  Result Value Ref Range Status   Streptococcus, Group A Screen (Direct) NEGATIVE NEGATIVE Final    Comment: (NOTE) A Rapid Antigen test may result negative if the antigen level in the sample is below the detection level of this test. The FDA has not cleared this test as a stand-alone test therefore the rapid antigen negative result has reflexed to a Group A Strep culture.   Culture, group A strep     Status: None   Collection Time: 03/15/16  6:45 PM  Result Value Ref Range Status   Specimen Description THROAT  Final   Special Requests NONE Reflexed from [id number]  Final   Culture   Final    CULTURE REINCUBATED FOR BETTER GROWTH Performed at [facility]    Report Status 03/17/2016 FINAL  Final  Blood culture (routine x 2)     Status: None (Preliminary result)   Collection Time: 03/16/16  5:40 PM  Result Value Ref Range Status   Specimen Description BLOOD  Final   Special Requests NONE  Final   Culture NO GROWTH < 24 HOURS  Final   Report Status PENDING  Incomplete  Blood  culture (routine x 2)     Status: None (Preliminary result)   Collection Time: 03/16/16  5:50 PM  Result Value Ref Range Status   Specimen Description BLOOD  Final   Special Requests NONE  Final   Culture NO GROWTH < 24 HOURS  Final   Report Status PENDING  Incomplete         Radiology Studies: Dg Chest Port 1 View  Result Date: 03/17/2016 CLINICAL DATA:  Sepsis. EXAM: PORTABLE CHEST 1 VIEW COMPARISON:  03/15/2016 FINDINGS: Shallow inspiration with atelectasis in the lung bases. No focal consolidation. Normal heart size and pulmonary vascularity. No blunting of costophrenic angles. No pneumothorax. Mediastinal contours appear intact. IMPRESSION: Shallow inspiration with atelectasis in  the lung bases. Electronically Signed   By: Lucienne Capers M.D.   On: 03/17/2016 00:36   US Abdomen Limited Ruq  Result Date: 03/17/2016 CLINICAL DATA:  Elevated liver function tests.  Sepsis. EXAM: US ABDOMEN LIMITED - RIGHT UPPER QUADRANT COMPARISON:  None. FINDINGS: Gallbladder: At least 1 gallstone lodged in the gallbladder neck, measuring 1.6 cm in maximum diameter. No gallbladder wall thickening or pericholecystic fluid. The patient was not focally tender over the gallbladder. Common bile duct: Diameter: 5.7 mm Liver: Diffusely echogenic.  No mass seen. IMPRESSION: 1. Cholelithiasis. 2. Diffusely echogenic liver, most likely due to steatosis. Electronically Signed   By: Claudie Revering M.D.   On: 03/17/2016 14:42        Scheduled Meds: . doxycycline (VIBRAMYCIN) IV  200 mg Intravenous Q12H  . insulin aspart  0-15 Units Subcutaneous TID WC  . omega-3 acid ethyl esters  1 g Oral BID  . piperacillin-tazobactam (ZOSYN)  IV  3.375 g Intravenous Q8H  . potassium chloride  10 mEq Oral Daily  . tamsulosin  0.4 mg Oral BID  . vancomycin  1,250 mg Intravenous Q12H   Continuous Infusions:    LOS: 0 days    Time spent: 25 minutes. Greater than 50% of this time was spent in direct contact with the  patient coordinating care.     Lelon Frohlich, MD [facility] Pager [phone]  If 7PM-7AM, please contact night-coverage [email or web address] Password [email or web address] 03/17/2016, 6:20 PM

## 2016-03-18 DIAGNOSIS — D72819 Decreased white blood cell count, unspecified: Secondary | ICD-10-CM | POA: Diagnosis not present

## 2016-03-18 DIAGNOSIS — I10 Essential (primary) hypertension: Secondary | ICD-10-CM | POA: Diagnosis not present

## 2016-03-18 DIAGNOSIS — E860 Dehydration: Secondary | ICD-10-CM | POA: Diagnosis not present

## 2016-03-18 DIAGNOSIS — D696 Thrombocytopenia, unspecified: Secondary | ICD-10-CM | POA: Diagnosis not present

## 2016-03-18 LAB — HIV ANTIBODY (ROUTINE TESTING W REFLEX): HIV Screen 4th Generation wRfx: NONREACTIVE

## 2016-03-18 LAB — GLUCOSE, CAPILLARY
GLUCOSE-CAPILLARY: 196 mg/dL — AB (ref 65–99)
GLUCOSE-CAPILLARY: 212 mg/dL — AB (ref 65–99)
GLUCOSE-CAPILLARY: 219 mg/dL — AB (ref 65–99)
Glucose-Capillary: 124 mg/dL — ABNORMAL HIGH (ref 65–99)

## 2016-03-18 LAB — HEMOGLOBIN A1C
Hgb A1c MFr Bld: 7.7 % — ABNORMAL HIGH (ref 4.8–5.6)
Mean Plasma Glucose: 174 mg/dL

## 2016-03-18 LAB — URINE CULTURE: Culture: NO GROWTH

## 2016-03-18 NOTE — Progress Notes (Signed)
PROGRESS NOTE    Bradley Hurst  [id number] DOB: Nov 16, 1960 DOA: 03/16/2016 PCP: [name], MD     Brief Narrative:  55 y/o man admitted on 8/13 with fever, rash and malaise. Source remains unclear. Suspect RMSF.  Assessment & Plan:   Principal Problem:   Dehydration Active Problems:   Sepsis ([place])   Leukopenia   Thrombocytopenia (HCC)   Essential hypertension   Diabetes mellitus type II, non insulin dependent (HCC)   Elevated LFTs   Fever/Rash/Malaise -Altho diagnosis is unclear, I am suspicious for RMSF given above as well as lab abnormalities such as hyponatremia, thrombocytopenia, leukopenia and transaminitis. -RMSF and ehrlichia titers have been requested. -Have commenced treatment with doxycycline. -Rash is maculopapular and mainly over torso, but also present on extremities. -patient states that 2 weeks ago his spouse pulled a tick off his back. -Will DC vanc and zosyn  (?what are we treating). -CXR/UA without infection, cx data pending. -Seems to be defervescing with treatment, altho had a temp of 100.9 last pm. Monitor fever curve.  DM II -Fair control. -Continue current management.  HTN -Well controlled   DVT prophylaxis: SCDs Code Status: Full Code Family Communication: Patient only Disposition Plan: Home when ready; likely 24-48 hours.  Consultants:   None  Procedures:   None  Antimicrobials:   Doxycycline    Subjective: Feels much improved today, rash is improving.  Objective: Vitals:   03/17/16 2159 03/18/16 0000 03/18/16 0658 03/18/16 1316  BP: (!) 131/57  (!) 142/90 (!) 108/59  Pulse: 94  93 74  Resp: 20  16 20   Temp: (!) 100.9 F (38.3 C) 99.3 F (37.4 C) 98.7 F (37.1 C) 98.2 F (36.8 C)  TempSrc: Oral  Oral Oral  SpO2: 92%  98% 98%  Weight:   131.8 kg (290 lb 8 oz)   Height:        Intake/Output Summary (Last 24 hours) at 03/18/16 1541 Last data filed at 03/18/16 1317  Gross per 24 hour  Intake              740  ml  Output             2901 ml  Net            -2161 ml   Filed Weights   03/16/16 2303 03/17/16 0150 03/18/16 0658  Weight: 131.8 kg (290 lb 9.1 oz) 131.8 kg (290 lb 9.1 oz) 131.8 kg (290 lb 8 oz)    Examination:   General exam: Alert, awake, oriented x 3 Respiratory system: Clear to auscultation. Respiratory effort normal. Cardiovascular system:RRR. No murmurs, rubs, gallops. Gastrointestinal system: Abdomen is nondistended, soft and nontender. No organomegaly or masses felt. Normal bowel sounds heard. Central nervous system: Alert and oriented. No focal neurological deficits. Extremities: No C/C/E, +pedal pulses Skin: maculopapular rash over torso and extremities Psychiatry: Judgement and insight appear normal. Mood & affect appropriate.     Data Reviewed: I have personally reviewed following labs and imaging studies  CBC:  Recent Labs Lab 03/15/16 1719 03/16/16 1740 03/17/16 0701  WBC 6.0 3.7* 2.5*  NEUTROABS 5.0 3.2  --   HGB 15.1 14.6 13.1  HCT 44.1 43.6 38.3*  MCV 84.8 85.5 84.5  PLT 109* 86* 76*   Basic Metabolic Panel:  Recent Labs Lab 03/15/16 1719 03/16/16 1740 03/17/16 0701  NA 131* 135 133*  K 3.7 3.7 3.3*  CL 104 103 104  CO2 21* 22 19*  GLUCOSE 199* 159* 165*  BUN 18 18 21*  CREATININE 0.92 0.99 0.79  CALCIUM 8.2* 8.0* 7.7*   GFR: Estimated Creatinine Clearance: 140.3 mL/min (by C-G formula based on SCr of 0.8 mg/dL). Liver Function Tests:  Recent Labs Lab 03/16/16 2328  AST 98*  ALT 96*  ALKPHOS 60  BILITOT 2.2*  PROT 6.0*  ALBUMIN 3.4*   No results for input(s): LIPASE, AMYLASE in the last 168 hours. No results for input(s): AMMONIA in the last 168 hours. Coagulation Profile:  Recent Labs Lab 03/16/16 2328  INR 1.16   Cardiac Enzymes: No results for input(s): CKTOTAL, CKMB, CKMBINDEX, TROPONINI in the last 168 hours. BNP (last 3 results) No results for input(s): PROBNP in the last 8760 hours. HbA1C:  Recent Labs   03/17/16 0041  HGBA1C 7.7*   CBG:  Recent Labs Lab 03/17/16 1146 03/17/16 1619 03/17/16 2108 03/18/16 0731 03/18/16 1123  GLUCAP 159* 158* 152* 124* 196*   Lipid Profile: No results for input(s): CHOL, HDL, LDLCALC, TRIG, CHOLHDL, LDLDIRECT in the last 72 hours. Thyroid Function Tests: No results for input(s): TSH, T4TOTAL, FREET4, T3FREE, THYROIDAB in the last 72 hours. Anemia Panel: No results for input(s): VITAMINB12, FOLATE, FERRITIN, TIBC, IRON, RETICCTPCT in the last 72 hours. Urine analysis:    Component Value Date/Time   COLORURINE YELLOW 03/17/2016 0220   APPEARANCEUR CLEAR 03/17/2016 0220   LABSPEC 1.015 03/17/2016 0220   PHURINE 6.0 03/17/2016 0220   GLUCOSEU >1000 (A) 03/17/2016 0220   HGBUR NEGATIVE 03/17/2016 0220   BILIRUBINUR NEGATIVE 03/17/2016 0220   KETONESUR >80 (A) 03/17/2016 0220   PROTEINUR TRACE (A) 03/17/2016 0220   NITRITE NEGATIVE 03/17/2016 0220   LEUKOCYTESUR NEGATIVE 03/17/2016 0220   Sepsis Labs: @LABRCNTIP (procalcitonin:4,lacticidven:4)  ) Recent Results (from the past 240 hour(s))  Rapid strep screen     Status: None   Collection Time: 03/15/16  6:45 PM  Result Value Ref Range Status   Streptococcus, Group A Screen (Direct) NEGATIVE NEGATIVE Final    Comment: (NOTE) A Rapid Antigen test may result negative if the antigen level in the sample is below the detection level of this test. The FDA has not cleared this test as a stand-alone test therefore the rapid antigen negative result has reflexed to a Group A Strep culture.   Culture, group A strep     Status: None (Preliminary result)   Collection Time: 03/15/16  6:45 PM  Result Value Ref Range Status   Specimen Description THROAT  Final   Special Requests NONE Reflexed from [id number]  Final   Culture   Final    NO GROUP A STREP (S.PYOGENES) ISOLATED Performed at [facility]    Report Status PENDING  Incomplete  Blood culture (routine x 2)     Status: None (Preliminary  result)   Collection Time: 03/16/16  5:40 PM  Result Value Ref Range Status   Specimen Description BLOOD  Final   Special Requests NONE  Final   Culture NO GROWTH < 24 HOURS  Final   Report Status PENDING  Incomplete  Blood culture (routine x 2)     Status: None (Preliminary result)   Collection Time: 03/16/16  5:50 PM  Result Value Ref Range Status   Specimen Description BLOOD  Final   Special Requests NONE  Final   Culture NO GROWTH < 24 HOURS  Final   Report Status PENDING  Incomplete  Urine culture     Status: None   Collection Time: 03/17/16  2:20 AM  Result Value  Ref Range Status   Specimen Description URINE, CLEAN CATCH  Final   Special Requests NONE  Final   Culture NO GROWTH Performed at [facility]   Final   Report Status 03/18/2016 FINAL  Final         Radiology Studies: Dg Chest Port 1 View  Result Date: 03/17/2016 CLINICAL DATA:  Sepsis. EXAM: PORTABLE CHEST 1 VIEW COMPARISON:  03/15/2016 FINDINGS: Shallow inspiration with atelectasis in the lung bases. No focal consolidation. Normal heart size and pulmonary vascularity. No blunting of costophrenic angles. No pneumothorax. Mediastinal contours appear intact. IMPRESSION: Shallow inspiration with atelectasis in the lung bases. Electronically Signed   By: Lucienne Capers M.D.   On: 03/17/2016 00:36   US Abdomen Limited Ruq  Result Date: 03/17/2016 CLINICAL DATA:  Elevated liver function tests.  Sepsis. EXAM: US ABDOMEN LIMITED - RIGHT UPPER QUADRANT COMPARISON:  None. FINDINGS: Gallbladder: At least 1 gallstone lodged in the gallbladder neck, measuring 1.6 cm in maximum diameter. No gallbladder wall thickening or pericholecystic fluid. The patient was not focally tender over the gallbladder. Common bile duct: Diameter: 5.7 mm Liver: Diffusely echogenic.  No mass seen. IMPRESSION: 1. Cholelithiasis. 2. Diffusely echogenic liver, most likely due to steatosis. Electronically Signed   By: Claudie Revering M.D.   On:  03/17/2016 14:42        Scheduled Meds: . doxycycline (VIBRAMYCIN) IV  200 mg Intravenous Q12H  . insulin aspart  0-15 Units Subcutaneous TID WC  . omega-3 acid ethyl esters  1 g Oral BID  . potassium chloride  10 mEq Oral Daily  . tamsulosin  0.4 mg Oral BID   Continuous Infusions:    LOS: 0 days    Time spent: 25 minutes. Greater than 50% of this time was spent in direct contact with the patient coordinating care.     Lelon Frohlich, MD [facility] Pager [phone]  If 7PM-7AM, please contact night-coverage [email or web address] Password [facility] 03/18/2016, 3:41 PM

## 2016-03-19 DIAGNOSIS — Z79899 Other long term (current) drug therapy: Secondary | ICD-10-CM | POA: Diagnosis not present

## 2016-03-19 DIAGNOSIS — I10 Essential (primary) hypertension: Secondary | ICD-10-CM | POA: Diagnosis present

## 2016-03-19 DIAGNOSIS — E872 Acidosis: Secondary | ICD-10-CM | POA: Diagnosis present

## 2016-03-19 DIAGNOSIS — Z7984 Long term (current) use of oral hypoglycemic drugs: Secondary | ICD-10-CM | POA: Diagnosis not present

## 2016-03-19 DIAGNOSIS — R7989 Other specified abnormal findings of blood chemistry: Secondary | ICD-10-CM

## 2016-03-19 DIAGNOSIS — A419 Sepsis, unspecified organism: Secondary | ICD-10-CM | POA: Diagnosis present

## 2016-03-19 DIAGNOSIS — D72810 Lymphocytopenia: Secondary | ICD-10-CM | POA: Diagnosis present

## 2016-03-19 DIAGNOSIS — E119 Type 2 diabetes mellitus without complications: Secondary | ICD-10-CM | POA: Diagnosis present

## 2016-03-19 DIAGNOSIS — E871 Hypo-osmolality and hyponatremia: Secondary | ICD-10-CM | POA: Diagnosis present

## 2016-03-19 DIAGNOSIS — R21 Rash and other nonspecific skin eruption: Secondary | ICD-10-CM | POA: Diagnosis present

## 2016-03-19 DIAGNOSIS — D72819 Decreased white blood cell count, unspecified: Secondary | ICD-10-CM

## 2016-03-19 DIAGNOSIS — E860 Dehydration: Secondary | ICD-10-CM

## 2016-03-19 DIAGNOSIS — D696 Thrombocytopenia, unspecified: Secondary | ICD-10-CM | POA: Diagnosis present

## 2016-03-19 LAB — CBC WITH DIFFERENTIAL/PLATELET
Basophils Absolute: 0 10*3/uL (ref 0.0–0.1)
Basophils Relative: 0 %
EOS PCT: 0 %
Eosinophils Absolute: 0 10*3/uL (ref 0.0–0.7)
HCT: 38.9 % — ABNORMAL LOW (ref 39.0–52.0)
Hemoglobin: 13.3 g/dL (ref 13.0–17.0)
LYMPHS ABS: 0.8 10*3/uL (ref 0.7–4.0)
LYMPHS PCT: 21 %
MCH: 28.4 pg (ref 26.0–34.0)
MCHC: 34.2 g/dL (ref 30.0–36.0)
MCV: 83.1 fL (ref 78.0–100.0)
MONO ABS: 0.4 10*3/uL (ref 0.1–1.0)
Monocytes Relative: 10 %
Neutro Abs: 2.7 10*3/uL (ref 1.7–7.7)
Neutrophils Relative %: 69 %
PLATELETS: 93 10*3/uL — AB (ref 150–400)
RBC: 4.68 MIL/uL (ref 4.22–5.81)
RDW: 13.3 % (ref 11.5–15.5)
WBC: 3.9 10*3/uL — ABNORMAL LOW (ref 4.0–10.5)

## 2016-03-19 LAB — BASIC METABOLIC PANEL
Anion gap: 8 (ref 5–15)
BUN: 12 mg/dL (ref 6–20)
CALCIUM: 7.7 mg/dL — AB (ref 8.9–10.3)
CO2: 24 mmol/L (ref 22–32)
CREATININE: 0.59 mg/dL — AB (ref 0.61–1.24)
Chloride: 102 mmol/L (ref 101–111)
Glucose, Bld: 168 mg/dL — ABNORMAL HIGH (ref 65–99)
Potassium: 3 mmol/L — ABNORMAL LOW (ref 3.5–5.1)
SODIUM: 134 mmol/L — AB (ref 135–145)

## 2016-03-19 LAB — GLUCOSE, CAPILLARY
GLUCOSE-CAPILLARY: 206 mg/dL — AB (ref 65–99)
GLUCOSE-CAPILLARY: 253 mg/dL — AB (ref 65–99)
Glucose-Capillary: 172 mg/dL — ABNORMAL HIGH (ref 65–99)
Glucose-Capillary: 179 mg/dL — ABNORMAL HIGH (ref 65–99)
Glucose-Capillary: 196 mg/dL — ABNORMAL HIGH (ref 65–99)

## 2016-03-19 LAB — MAGNESIUM: Magnesium: 2 mg/dL (ref 1.7–2.4)

## 2016-03-19 LAB — ROCKY MTN SPOTTED FVR ABS PNL(IGG+IGM)
RMSF IGG: NEGATIVE
RMSF IGM: 0.18 {index} (ref 0.00–0.89)

## 2016-03-19 MED ORDER — DIPHENHYDRAMINE-ZINC ACETATE 2-0.1 % EX CREA
TOPICAL_CREAM | Freq: Three times a day (TID) | CUTANEOUS | Status: DC | PRN
  Filled 2016-03-19: qty 28

## 2016-03-19 MED ORDER — POTASSIUM CHLORIDE CRYS ER 20 MEQ PO TBCR
40.0000 meq | EXTENDED_RELEASE_TABLET | Freq: Two times a day (BID) | ORAL | Status: AC
  Administered 2016-03-19 (×2): 40 meq via ORAL
  Filled 2016-03-19 (×3): qty 2

## 2016-03-19 NOTE — Progress Notes (Addendum)
PROGRESS NOTE    Bradley Hurst  [id number] DOB: [birth year] DOA: 03/16/2016 PCP: [name], MD     Brief Narrative:  55 y/o man admitted on 8/13 with fever, rash and malaise.  Assessment & Plan:   Principal Problem:   Dehydration Active Problems:   Fever   Sepsis (HCC)   Leukopenia   Thrombocytopenia (HCC)   Essential hypertension   Diabetes mellitus type II, non insulin dependent (HCC)   Elevated LFTs   Fever/Rash/Malaise -Diagnosis is uncertain. [facility] spotted fever serologies are negative. -Patient does describe tick bites proximally 3 months prior to this hospital admission. Thus far, serologies are all unremarkable this time.  -Patient is continued on empiric doxycycline. -Rash is maculopapular and mainly over torso, but also present on extremities. Patient reports rash appears to be worse today. Nonpruritic -CXR/UA unremarkable. -Patient has remained afebrile over the past 24 hours. We'll continue doxycycline. Hopeful patient's rash will eventually dissipate. We'll give a trial of Benadryl cream for the rash  DM II -Fair control. -Continue current management.  HTN -Well controlled -Continue to monitor  Elevated LFTs -Right upper quadrant ultrasound results reviewed -Patient has at least one gallstone noted on imaging. There does not seem to be acute obstruction. There is no Murphy sign on exam. No biliary ductal dilatation. No gallbladder wall thickening. Doubt acute cholecystitis. -Would have patient follow-up closely with GI/general surgery as outpatient. Suspect patient would require a cholecystectomy down the road  DVT prophylaxis: SCDs Code Status: Full Code Family Communication: Patient only Disposition Plan: Anticipate possible home in 24-48 hours if stable  Consultants:   None  Procedures:   None  Antimicrobials:  Antibiotics Given (last 72 hours)    Date/Time Action Medication Dose Rate   03/16/16 2353 Given   piperacillin-tazobactam (ZOSYN) IVPB 3.375 g 3.375 g 100 mL/hr   03/16/16 2353 Given   vancomycin (VANCOCIN) IVPB 1000 mg/200 mL premix 2,000 mg 400 mL/hr   03/17/16 [id number] Given   doxycycline (VIBRAMYCIN) 200 mg in dextrose 5 % 250 mL IVPB 200 mg 125 mL/hr   03/17/16 0521 Given   piperacillin-tazobactam (ZOSYN) IVPB 3.375 g 3.375 g 12.5 mL/hr   03/17/16 1238 Given   doxycycline (VIBRAMYCIN) 200 mg in dextrose 5 % 250 mL IVPB 200 mg 125 mL/hr   03/17/16 1543 Given   piperacillin-tazobactam (ZOSYN) IVPB 3.375 g 3.375 g 12.5 mL/hr   03/18/16 0003 Given   doxycycline (VIBRAMYCIN) 200 mg in dextrose 5 % 250 mL IVPB 200 mg 125 mL/hr   03/18/16 1200 Given   doxycycline (VIBRAMYCIN) 200 mg in dextrose 5 % 250 mL IVPB 200 mg 125 mL/hr   03/18/16 2321 Given   doxycycline (VIBRAMYCIN) 200 mg in dextrose 5 % 250 mL IVPB 200 mg 125 mL/hr   03/19/16 1303 Given   doxycycline (VIBRAMYCIN) 200 mg in dextrose 5 % 250 mL IVPB 200 mg 125 mL/hr       Subjective: Reports feeling overall better, however states rash seems worse today  Objective: Vitals:   03/18/16 1316 03/18/16 2014 03/19/16 0549 03/19/16 1343  BP: (!) 108/59 114/70 130/73 111/66  Pulse: 74 89 81 79  Resp: 20 18 20 20   Temp: 98.2 F (36.8 C) 99.3 F (37.4 C) 98.7 F (37.1 C) 97.8 F (36.6 C)  TempSrc: Oral Oral Oral Oral  SpO2: 98% 99% 97% 97%  Weight:   130.7 kg (288 lb 3.2 oz)   Height:   5\' 10"  (1.778 m)     Intake/Output  Summary (Last 24 hours) at 03/19/16 1802 Last data filed at 03/19/16 1342  Gross per 24 hour  Intake              730 ml  Output             1650 ml  Net             -920 ml   Filed Weights   03/17/16 0150 03/18/16 0658 03/19/16 0549  Weight: 131.8 kg (290 lb 9.1 oz) 131.8 kg (290 lb 8 oz) 130.7 kg (288 lb 3.2 oz)    Examination:   General exam: Alert, awake, oriented x 3, Lying in bed Respiratory system: Clear to auscultation. Respiratory effort normal. Cardiovascular system:RRR. No murmurs,  rubs, gallops. Gastrointestinal system: Abdomen is nondistended, soft and nontender. No organomegaly or masses felt. Normal bowel sounds heard. Central nervous system: Alert and oriented. No focal neurological deficits. Extremities: No C/C/E, +pedal pulses Skin: maculopapular rash over torso and extremities, Nonpruritic Psychiatry: Judgement and insight appear normal. Mood & affect appropriate.     Data Reviewed: I have personally reviewed following labs and imaging studies  CBC:  Recent Labs Lab 03/15/16 1719 03/16/16 1740 03/17/16 0701 03/19/16 0758  WBC 6.0 3.7* 2.5* 3.9*  NEUTROABS 5.0 3.2  --  2.7  HGB 15.1 14.6 13.1 13.3  HCT 44.1 43.6 38.3* 38.9*  MCV 84.8 85.5 84.5 83.1  PLT 109* 86* 76* 93*   Basic Metabolic Panel:  Recent Labs Lab 03/15/16 1719 03/16/16 1740 03/17/16 0701 03/19/16 0758 03/19/16 1200  NA 131* 135 133* 134*  --   K 3.7 3.7 3.3* 3.0*  --   CL 104 103 104 102  --   CO2 21* 22 19* 24  --   GLUCOSE 199* 159* 165* 168*  --   BUN 18 18 21* 12  --   CREATININE 0.92 0.99 0.79 0.59*  --   CALCIUM 8.2* 8.0* 7.7* 7.7*  --   MG  --   --   --   --  2.0   GFR: Estimated Creatinine Clearance: 141.8 mL/min (by C-G formula based on SCr of 0.8 mg/dL). Liver Function Tests:  Recent Labs Lab 03/16/16 2328  AST 98*  ALT 96*  ALKPHOS 60  BILITOT 2.2*  PROT 6.0*  ALBUMIN 3.4*   No results for input(s): LIPASE, AMYLASE in the last 168 hours. No results for input(s): AMMONIA in the last 168 hours. Coagulation Profile:  Recent Labs Lab 03/16/16 2328  INR 1.16   Cardiac Enzymes: No results for input(s): CKTOTAL, CKMB, CKMBINDEX, TROPONINI in the last 168 hours. BNP (last 3 results) No results for input(s): PROBNP in the last 8760 hours. HbA1C:  Recent Labs  03/17/16 0041  HGBA1C 7.7*   CBG:  Recent Labs Lab 03/18/16 2012 03/19/16 0804 03/19/16 0836 03/19/16 1137 03/19/16 1653  GLUCAP 219* 172* 179* 206* 196*   Lipid Profile: No  results for input(s): CHOL, HDL, LDLCALC, TRIG, CHOLHDL, LDLDIRECT in the last 72 hours. Thyroid Function Tests: No results for input(s): TSH, T4TOTAL, FREET4, T3FREE, THYROIDAB in the last 72 hours. Anemia Panel: No results for input(s): VITAMINB12, FOLATE, FERRITIN, TIBC, IRON, RETICCTPCT in the last 72 hours. Urine analysis:    Component Value Date/Time   COLORURINE YELLOW 03/17/2016 0220   APPEARANCEUR CLEAR 03/17/2016 0220   LABSPEC 1.015 03/17/2016 0220   PHURINE 6.0 03/17/2016 0220   GLUCOSEU >1000 (A) 03/17/2016 0220   HGBUR NEGATIVE 03/17/2016 0220   BILIRUBINUR NEGATIVE 03/17/2016  [place] >80 (A) 03/17/2016 0220   PROTEINUR TRACE (A) 03/17/2016 0220   NITRITE NEGATIVE 03/17/2016 0220   LEUKOCYTESUR NEGATIVE 03/17/2016 0220   Sepsis Labs: @LABRCNTIP (procalcitonin:4,lacticidven:4)  ) Recent Results (from the past 240 hour(s))  Rapid strep screen     Status: None   Collection Time: 03/15/16  6:45 PM  Result Value Ref Range Status   Streptococcus, Group A Screen (Direct) NEGATIVE NEGATIVE Final    Comment: (NOTE) A Rapid Antigen test may result negative if the antigen level in the sample is below the detection level of this test. The FDA has not cleared this test as a stand-alone test therefore the rapid antigen negative result has reflexed to a Group A Strep culture.   Culture, group A strep     Status: None (Preliminary result)   Collection Time: 03/15/16  6:45 PM  Result Value Ref Range Status   Specimen Description THROAT  Final   Special Requests NONE Reflexed from [id number]  Final   Culture   Final    NO GROUP A STREP (S.PYOGENES) ISOLATED Performed at [facility]    Report Status PENDING  Incomplete  Blood culture (routine x 2)     Status: None (Preliminary result)   Collection Time: 03/16/16  5:40 PM  Result Value Ref Range Status   Specimen Description BLOOD LEFT ANTECUBITAL  Final   Special Requests BOTTLES DRAWN AEROBIC AND ANAEROBIC [place]  Final   Culture NO GROWTH 3 DAYS  Final   Report Status PENDING  Incomplete  Blood culture (routine x 2)     Status: None (Preliminary result)   Collection Time: 03/16/16  5:50 PM  Result Value Ref Range Status   Specimen Description BLOOD RIGHT HAND  Final   Special Requests BOTTLES DRAWN AEROBIC AND ANAEROBIC [place]  Final   Culture NO GROWTH 3 DAYS  Final   Report Status PENDING  Incomplete  Urine culture     Status: None   Collection Time: 03/17/16  2:20 AM  Result Value Ref Range Status   Specimen Description URINE, CLEAN CATCH  Final   Special Requests NONE  Final   Culture NO GROWTH Performed at [facility]   Final   Report Status 03/18/2016 FINAL  Final         Radiology Studies: No results found.   Scheduled Meds: . doxycycline (VIBRAMYCIN) IV  200 mg Intravenous Q12H  . insulin aspart  0-15 Units Subcutaneous TID WC  . omega-3 acid ethyl esters  1 g Oral BID  . potassium chloride  40 mEq Oral BID  . tamsulosin  0.4 mg Oral BID   Continuous Infusions:    LOS: 0 days    Myrian Botello, Orpah Melter, MD [facility] Pager [phone]  If 7PM-7AM, please contact night-coverage [email or web address] Password [facility] 03/19/2016, 6:02 PM

## 2016-03-19 NOTE — Progress Notes (Signed)
Inpatient Diabetes Program Recommendations  AACE/ADA: New Consensus Statement on Inpatient Glycemic Control (2015)  Target Ranges:  Prepandial:   less than 140 mg/dL      Peak postprandial:   less than 180 mg/dL (1-2 hours)      Critically ill patients:  140 - 180 mg/dL   Lab Results  Component Value Date   GLUCAP 179 (H) 03/19/2016   HGBA1C 7.7 (H) 03/17/2016    Review of Glycemic ControlResults for KI, AKERMAN (MRN [id number]) as of 03/19/2016 11:17  Ref. Range 03/18/2016 11:23 03/18/2016 16:07 03/18/2016 20:12 03/19/2016 08:04 03/19/2016 08:36  Glucose-Capillary Latest Ref Range: 65 - 99 mg/dL 196 (H) 212 (H) 219 (H) 172 (H) 179 (H)    Diabetes history: Type 2 diabetes Outpatient Diabetes medications: Januvia 100 mg daily,Metformin 1000 mg bid, Jardiance 25 mg daily Current orders for Inpatient glycemic control:  Novolog moderate tid with meals  Inpatient Diabetes Program Recommendations:    Please consider restarting DPP-4 while patient is in the hospital.  [place] is Tradjenta 5 mg daily (however he can resume Januvia at d/c).    Thanks, Adah Perl, RN, BC-ADM Inpatient Diabetes Coordinator Pager [phone] (8a-5p)

## 2016-03-20 DIAGNOSIS — E119 Type 2 diabetes mellitus without complications: Secondary | ICD-10-CM

## 2016-03-20 LAB — CBC
HEMATOCRIT: 39 % (ref 39.0–52.0)
Hemoglobin: 13.3 g/dL (ref 13.0–17.0)
MCH: 28.7 pg (ref 26.0–34.0)
MCHC: 34.1 g/dL (ref 30.0–36.0)
MCV: 84.1 fL (ref 78.0–100.0)
PLATELETS: 126 10*3/uL — AB (ref 150–400)
RBC: 4.64 MIL/uL (ref 4.22–5.81)
RDW: 13.3 % (ref 11.5–15.5)
WBC: 4.6 10*3/uL (ref 4.0–10.5)

## 2016-03-20 LAB — COMPREHENSIVE METABOLIC PANEL
ALT: 103 U/L — ABNORMAL HIGH (ref 17–63)
ANION GAP: 6 (ref 5–15)
AST: 75 U/L — ABNORMAL HIGH (ref 15–41)
Albumin: 2.8 g/dL — ABNORMAL LOW (ref 3.5–5.0)
Alkaline Phosphatase: 65 U/L (ref 38–126)
BUN: 13 mg/dL (ref 6–20)
CHLORIDE: 103 mmol/L (ref 101–111)
CO2: 26 mmol/L (ref 22–32)
Calcium: 7.9 mg/dL — ABNORMAL LOW (ref 8.9–10.3)
Creatinine, Ser: 0.6 mg/dL — ABNORMAL LOW (ref 0.61–1.24)
Glucose, Bld: 213 mg/dL — ABNORMAL HIGH (ref 65–99)
POTASSIUM: 3.3 mmol/L — AB (ref 3.5–5.1)
Sodium: 135 mmol/L (ref 135–145)
Total Bilirubin: 1.1 mg/dL (ref 0.3–1.2)
Total Protein: 5.4 g/dL — ABNORMAL LOW (ref 6.5–8.1)

## 2016-03-20 LAB — CULTURE, GROUP A STREP (THRC)

## 2016-03-20 LAB — EHRLICHIA ANTIBODY PANEL
E chaffeensis (HGE) Ab, IgG: NEGATIVE
E chaffeensis (HGE) Ab, IgM: NEGATIVE
E. Chaffeensis (HME) IgM Titer: NEGATIVE
E.Chaffeensis (HME) IgG: NEGATIVE

## 2016-03-20 LAB — GLUCOSE, CAPILLARY: Glucose-Capillary: 198 mg/dL — ABNORMAL HIGH (ref 65–99)

## 2016-03-20 MED ORDER — DOXYCYCLINE HYCLATE 100 MG PO TABS
100.0000 mg | ORAL_TABLET | Freq: Two times a day (BID) | ORAL | Status: DC
  Administered 2016-03-20: 100 mg via ORAL
  Filled 2016-03-20: qty 1

## 2016-03-20 MED ORDER — DOXYCYCLINE HYCLATE 100 MG PO TABS: 100.0000 mg | ORAL_TABLET | Freq: Two times a day (BID) | ORAL | 0 refills | Status: DC

## 2016-03-20 MED ORDER — POTASSIUM CHLORIDE CRYS ER 20 MEQ PO TBCR
40.0000 meq | EXTENDED_RELEASE_TABLET | Freq: Two times a day (BID) | ORAL | Status: DC
  Administered 2016-03-20: 40 meq via ORAL
  Filled 2016-03-20: qty 2

## 2016-03-20 MED ORDER — DIPHENHYDRAMINE-ZINC ACETATE 2-0.1 % EX CREA: TOPICAL_CREAM | Freq: Three times a day (TID) | CUTANEOUS | 0 refills | Status: DC | PRN

## 2016-03-20 NOTE — Progress Notes (Signed)
Inpatient Diabetes Program Recommendations  AACE/ADA: New Consensus Statement on Inpatient Glycemic Control (2015)  Target Ranges:  Prepandial:   less than 140 mg/dL      Peak postprandial:   less than 180 mg/dL (1-2 hours)      Critically ill patients:  140 - 180 mg/dL  Results for AVEDIS, CALLO (MRN [id number]) as of 03/20/2016 10:03  Ref. Range 03/19/2016 08:36 03/19/2016 11:37 03/19/2016 16:53 03/19/2016 21:22 03/20/2016 07:46  Glucose-Capillary Latest Ref Range: 65 - 99 mg/dL 179 (H) 206 (H) 196 (H) 253 (H) 198 (H)    Review of Glycemic Control  Diabetes history: DM2 Outpatient Diabetes medications: Januvia 100 mg daily,Metformin 1000 mg BID, Jardiance 25 mg daily Current orders for Inpatient glycemic control: Novolog 0-15 units TID with meals  Inpatient Diabetes Program Recommendations: Correction (SSI): Please consider ordering Novolog bedtime correction scale. Oral Agents: May want to consider ordering DPP-4 while inpatient. If so, please order Tradjenta 5 mg daily (hospital formulary substituted for Januvia).  Thanks, Barnie Alderman, RN, MSN, CDE Diabetes Coordinator Inpatient Diabetes Program [phone] (Team Pager from [place] to [place]) [phone] ([place] office) [phone] [facility] office) [phone] [facility] office)

## 2016-03-20 NOTE — Progress Notes (Signed)
AVS reviewed with patient.  Verbalized understanding of discharge instructions, physician follow-up, medications.  Patient's IV removed.  Site WNL.  Patient reports belongings intact and in possession at time of discharge.  Patient transported by NT via w/c to main entrance for discharge.  Patient stable at time of discharge.

## 2016-03-20 NOTE — Discharge Summary (Addendum)
Physician Discharge Summary  Bradley Hurst [id number] DOB: 07/17/1961 DOA: 03/16/2016  PCP: Bradley Burly, MD  Admit date: 03/16/2016 Discharge date: 03/20/2016  Admitted From: Home Disposition:  Home  Recommendations for Outpatient Follow-up:  1. Follow up with PCP in 1-2 weeks 2. Recommend outpatient referral to general surgery to follow gallstone  Discharge Condition:Improved CODE STATUS:Full Diet recommendation: Diabetic   Brief/Interim Summary: 55 y.o. male with medical history significant for hypertension and type 2 diabetes mellitus presents to the emergency department for evaluation of fevers and malaise. Patient reports being in his usual state of health until 03/11/2016 when he developed subjective fevers and chills. He felt fine the following day, but the day after woke with subjective fever, malaise, lethargy, and wheezing. The wheezing resolved, but intermittent fevers and general malaise and lethargy persisted. Patient was evaluated in the emergency department yesterday for these complaints and and was noted to have fever, tachycardia, and lactic acid of 2.2. Workup with chest x-ray and urinalysis were not revealing of infectious focus and abdominal exam was benign. Patient was given 3 L of normal saline during that visit and lactic acid normalized. He was then discharged home with instructions to return for repeat lactic acid. Since returning home, he is continued to have intermittent fevers and malaise and returns today for recheck of lactic acid. There has been no interval development of dyspnea, cough, abdominal pain, dysuria, flank pain, headache, neck stiffness, ear pain, or sore throat. There was reportedly a maculopapular rash overlying his abdomen noted in the ED   Fever/Rash/Malaise with sepsis present on admission -Diagnosis is uncertain. [facility] spotted fever serologies are negative. -Patient does describe tick bites proximally 3 months prior to this hospital  admission. Thus far, serologies are all unremarkable this time.  -Patient had been continued on empiric doxycycline. -Rash is maculopapular and mainly over torso, but also present on extremities. Patient reports rash now appears to be improving. Nonpruritic -CXR/UA unremarkable. -Patient has remained afebrile over the past 48 hours. Will continue doxycycline 4 more days of doxycycline on discharge  DM II -Fair control. -Continue current management.  HTN -Well controlled -Off of bp meds on admission -As BP remains controlled, have advised patient to hold off on bp meds for the time being. Defer to PCP re: appropriateness to resuming BP meds  Elevated LFTs -Right upper quadrant ultrasound results reviewed -Patient has at least one gallstone noted on imaging. There does not seem to be acute obstruction. There is no Murphy sign on exam. No biliary ductal dilatation. No gallbladder wall thickening. Doubt acute cholecystitis. -Would have patient follow-up closely with GI/general surgery as outpatient. Suspect patient may require a cholecystectomy sometime down the road  Discharge Diagnoses:  Principal Problem:   Dehydration Active Problems:   Fever   Sepsis ([place])   Leukopenia   Thrombocytopenia (HCC)   Essential hypertension   Diabetes mellitus type II, non insulin dependent (HCC)   Elevated LFTs    Discharge Instructions     Medication List    STOP taking these medications   amLODipine 10 MG tablet Commonly known as:  NORVASC   losartan 100 MG tablet Commonly known as:  COZAAR     TAKE these medications   diphenhydrAMINE-zinc acetate cream Commonly known as:  BENADRYL Apply topically 3 (three) times daily as needed for itching.   doxycycline 100 MG tablet Commonly known as:  VIBRA-TABS Take 1 tablet (100 mg total) by mouth every 12 (twelve) hours.   JARDIANCE 25 MG  Tabs tablet Generic drug:  empagliflozin Take 25 mg by mouth daily.   metFORMIN 1000 MG  tablet Commonly known as:  GLUCOPHAGE Take 1,000 mg by mouth 2 (two) times daily with a meal.   omega-3 acid ethyl esters 1 g capsule Commonly known as:  LOVAZA Take 1 g by mouth 2 (two) times daily.   Potassium Chloride CR 8 MEQ Cpcr capsule CR Commonly known as:  MICRO-K Take 8 mEq by mouth at bedtime.   sitaGLIPtin 100 MG tablet Commonly known as:  JANUVIA Take 100 mg by mouth daily.   tamsulosin 0.4 MG Caps capsule Commonly known as:  FLOMAX Take 0.4 mg by mouth 2 (two) times daily.      Follow-up Information    Bradley Burly, MD .   Specialty:  Internal Medicine Why:  Follow-up appointment in 1-2 weeks Contact information: [place] [id number] [phone]          No Known Allergies   Procedures/Studies: Dg Chest 2 View  Result Date: 03/15/2016 CLINICAL DATA:  Fever, malaise, SOB since Tuesday EXAM: CHEST  2 VIEW COMPARISON:  None. FINDINGS: The heart size and mediastinal contours are within normal limits. Both lungs are clear. No pleural effusion or pneumothorax. The visualized skeletal structures are unremarkable. IMPRESSION: No active cardiopulmonary disease. Electronically Signed   By: Lajean Manes M.D.   On: 03/15/2016 17:10   Dg Chest Port 1 View  Result Date: 03/17/2016 CLINICAL DATA:  Sepsis. EXAM: PORTABLE CHEST 1 VIEW COMPARISON:  03/15/2016 FINDINGS: Shallow inspiration with atelectasis in the lung bases. No focal consolidation. Normal heart size and pulmonary vascularity. No blunting of costophrenic angles. No pneumothorax. Mediastinal contours appear intact. IMPRESSION: Shallow inspiration with atelectasis in the lung bases. Electronically Signed   By: Lucienne Capers M.D.   On: 03/17/2016 00:36   US Abdomen Limited Ruq  Result Date: 03/17/2016 CLINICAL DATA:  Elevated liver function tests.  Sepsis. EXAM: US ABDOMEN LIMITED - RIGHT UPPER QUADRANT COMPARISON:  None. FINDINGS: Gallbladder: At least 1 gallstone lodged in the  gallbladder neck, measuring 1.6 cm in maximum diameter. No gallbladder wall thickening or pericholecystic fluid. The patient was not focally tender over the gallbladder. Common bile duct: Diameter: 5.7 mm Liver: Diffusely echogenic.  No mass seen. IMPRESSION: 1. Cholelithiasis. 2. Diffusely echogenic liver, most likely due to steatosis. Electronically Signed   By: Claudie Revering M.D.   On: 03/17/2016 14:42    Subjective: Feels better today. Reports rash seems improved  Discharge Exam: Vitals:   03/19/16 2141 03/20/16 0544  BP: (!) 148/78 120/70  Pulse: 78 78  Resp: 20 20  Temp: 97.9 F (36.6 C) 97.7 F (36.5 C)   Vitals:   03/19/16 0549 03/19/16 1343 03/19/16 2141 03/20/16 0544  BP: 130/73 111/66 (!) 148/78 120/70  Pulse: 81 79 78 78  Resp: 20 20 20 20   Temp: 98.7 F (37.1 C) 97.8 F (36.6 C) 97.9 F (36.6 C) 97.7 F (36.5 C)  TempSrc: Oral Oral Oral Oral  SpO2: 97% 97% 96% 98%  Weight: 130.7 kg (288 lb 3.2 oz)   130.5 kg (287 lb 11.6 oz)  Height: 5\' 10"  (1.778 m)       General: Pt is alert, awake, not in acute distress Cardiovascular: RRR, S1/S2 +, no rubs, no gallops Respiratory: CTA bilaterally, no wheezing, no rhonchi Abdominal: Soft, NT, ND, bowel sounds + Extremities: no edema, no cyanosis   The results of significant diagnostics from this hospitalization (  including imaging, microbiology, ancillary and laboratory) are listed below for reference.     Microbiology: Recent Results (from the past 240 hour(s))  Rapid strep screen     Status: None   Collection Time: 03/15/16  6:45 PM  Result Value Ref Range Status   Streptococcus, Group A Screen (Direct) NEGATIVE NEGATIVE Final    Comment: (NOTE) A Rapid Antigen test may result negative if the antigen level in the sample is below the detection level of this test. The FDA has not cleared this test as a stand-alone test therefore the rapid antigen negative result has reflexed to a Group A Strep culture.   Culture,  group A strep     Status: None (Preliminary result)   Collection Time: 03/15/16  6:45 PM  Result Value Ref Range Status   Specimen Description THROAT  Final   Special Requests NONE Reflexed from [id number]  Final   Culture   Final    NO GROUP A STREP (S.PYOGENES) ISOLATED Performed at [facility]    Report Status PENDING  Incomplete  Blood culture (routine x 2)     Status: None (Preliminary result)   Collection Time: 03/16/16  5:40 PM  Result Value Ref Range Status   Specimen Description BLOOD LEFT ANTECUBITAL  Final   Special Requests BOTTLES DRAWN AEROBIC AND ANAEROBIC [place]  Final   Culture NO GROWTH 4 DAYS  Final   Report Status PENDING  Incomplete  Blood culture (routine x 2)     Status: None (Preliminary result)   Collection Time: 03/16/16  5:50 PM  Result Value Ref Range Status   Specimen Description BLOOD RIGHT HAND  Final   Special Requests BOTTLES DRAWN AEROBIC AND ANAEROBIC [place]  Final   Culture NO GROWTH 4 DAYS  Final   Report Status PENDING  Incomplete  Urine culture     Status: None   Collection Time: 03/17/16  2:20 AM  Result Value Ref Range Status   Specimen Description URINE, CLEAN CATCH  Final   Special Requests NONE  Final   Culture NO GROWTH Performed at [facility]   Final   Report Status 03/18/2016 FINAL  Final     Labs: BNP (last 3 results) No results for input(s): BNP in the last 8760 hours. Basic Metabolic Panel:  Recent Labs Lab 03/15/16 1719 03/16/16 1740 03/17/16 0701 03/19/16 0758 03/19/16 1200 03/20/16 0622  NA 131* 135 133* 134*  --  135  K 3.7 3.7 3.3* 3.0*  --  3.3*  CL 104 103 104 102  --  103  CO2 21* 22 19* 24  --  26  GLUCOSE 199* 159* 165* 168*  --  213*  BUN 18 18 21* 12  --  13  CREATININE 0.92 0.99 0.79 0.59*  --  0.60*  CALCIUM 8.2* 8.0* 7.7* 7.7*  --  7.9*  MG  --   --   --   --  2.0  --    Liver Function Tests:  Recent Labs Lab 03/16/16 2328 03/20/16 0622  AST 98* 75*  ALT 96* 103*   ALKPHOS 60 65  BILITOT 2.2* 1.1  PROT 6.0* 5.4*  ALBUMIN 3.4* 2.8*   No results for input(s): LIPASE, AMYLASE in the last 168 hours. No results for input(s): AMMONIA in the last 168 hours. CBC:  Recent Labs Lab 03/15/16 1719 03/16/16 1740 03/17/16 0701 03/19/16 0758 03/20/16 0622  WBC 6.0 3.7* 2.5* 3.9* 4.6  NEUTROABS 5.0 3.2  --  2.7  --   HGB 15.1 14.6 13.1 13.3 13.3  HCT 44.1 43.6 38.3* 38.9* 39.0  MCV 84.8 85.5 84.5 83.1 84.1  PLT 109* 86* 76* 93* 126*   Cardiac Enzymes: No results for input(s): CKTOTAL, CKMB, CKMBINDEX, TROPONINI in the last 168 hours. BNP: Invalid input(s): POCBNP CBG:  Recent Labs Lab 03/19/16 0836 03/19/16 1137 03/19/16 1653 03/19/16 2122 03/20/16 0746  GLUCAP 179* 206* 196* 253* 198*   D-Dimer No results for input(s): DDIMER in the last 72 hours. Hgb A1c No results for input(s): HGBA1C in the last 72 hours. Lipid Profile No results for input(s): CHOL, HDL, LDLCALC, TRIG, CHOLHDL, LDLDIRECT in the last 72 hours. Thyroid function studies No results for input(s): TSH, T4TOTAL, T3FREE, THYROIDAB in the last 72 hours.  Invalid input(s): FREET3 Anemia work up No results for input(s): VITAMINB12, FOLATE, FERRITIN, TIBC, IRON, RETICCTPCT in the last 72 hours. Urinalysis    Component Value Date/Time   COLORURINE YELLOW 03/17/2016 0220   APPEARANCEUR CLEAR 03/17/2016 0220   LABSPEC 1.015 03/17/2016 0220   PHURINE 6.0 03/17/2016 0220   GLUCOSEU >1000 (A) 03/17/2016 0220   HGBUR NEGATIVE 03/17/2016 0220   BILIRUBINUR NEGATIVE 03/17/2016 0220   KETONESUR >80 (A) 03/17/2016 0220   PROTEINUR TRACE (A) 03/17/2016 0220   NITRITE NEGATIVE 03/17/2016 0220   LEUKOCYTESUR NEGATIVE 03/17/2016 0220   Sepsis Labs Invalid input(s): PROCALCITONIN,  WBC,  LACTICIDVEN Microbiology Recent Results (from the past 240 hour(s))  Rapid strep screen     Status: None   Collection Time: 03/15/16  6:45 PM  Result Value Ref Range Status   Streptococcus,  Group A Screen (Direct) NEGATIVE NEGATIVE Final    Comment: (NOTE) A Rapid Antigen test may result negative if the antigen level in the sample is below the detection level of this test. The FDA has not cleared this test as a stand-alone test therefore the rapid antigen negative result has reflexed to a Group A Strep culture.   Culture, group A strep     Status: None (Preliminary result)   Collection Time: 03/15/16  6:45 PM  Result Value Ref Range Status   Specimen Description THROAT  Final   Special Requests NONE Reflexed from [id number]  Final   Culture   Final    NO GROUP A STREP (S.PYOGENES) ISOLATED Performed at [facility]    Report Status PENDING  Incomplete  Blood culture (routine x 2)     Status: None (Preliminary result)   Collection Time: 03/16/16  5:40 PM  Result Value Ref Range Status   Specimen Description BLOOD LEFT ANTECUBITAL  Final   Special Requests BOTTLES DRAWN AEROBIC AND ANAEROBIC [place]  Final   Culture NO GROWTH 4 DAYS  Final   Report Status PENDING  Incomplete  Blood culture (routine x 2)     Status: None (Preliminary result)   Collection Time: 03/16/16  5:50 PM  Result Value Ref Range Status   Specimen Description BLOOD RIGHT HAND  Final   Special Requests BOTTLES DRAWN AEROBIC AND ANAEROBIC [place]  Final   Culture NO GROWTH 4 DAYS  Final   Report Status PENDING  Incomplete  Urine culture     Status: None   Collection Time: 03/17/16  2:20 AM  Result Value Ref Range Status   Specimen Description URINE, CLEAN CATCH  Final   Special Requests NONE  Final   Culture NO GROWTH Performed at [facility]   Final   Report Status 03/18/2016 FINAL  Final     SIGNED:   Donne Hazel, MD  [facility] 03/20/2016, 12:58 PM  If 7PM-7AM, please contact night-coverage [email or web address] Password [email or web address]

## 2016-03-21 LAB — CULTURE, BLOOD (ROUTINE X 2)
CULTURE: NO GROWTH
Culture: NO GROWTH

## 2016-03-21 LAB — GLUCOSE, CAPILLARY: Glucose-Capillary: 207 mg/dL — ABNORMAL HIGH (ref 65–99)

## 2016-10-16 DIAGNOSIS — E1142 Type 2 diabetes mellitus with diabetic polyneuropathy: Secondary | ICD-10-CM | POA: Diagnosis not present

## 2016-10-16 DIAGNOSIS — N401 Enlarged prostate with lower urinary tract symptoms: Secondary | ICD-10-CM | POA: Diagnosis not present

## 2017-02-03 ENCOUNTER — Other Ambulatory Visit: Payer: Self-pay | Admitting: Urology

## 2017-03-12 ENCOUNTER — Encounter (HOSPITAL_BASED_OUTPATIENT_CLINIC_OR_DEPARTMENT_OTHER): Payer: Self-pay | Admitting: *Deleted

## 2017-03-17 ENCOUNTER — Encounter (HOSPITAL_BASED_OUTPATIENT_CLINIC_OR_DEPARTMENT_OTHER): Payer: Self-pay | Admitting: *Deleted

## 2017-03-17 NOTE — Progress Notes (Signed)
To [facility] at 0745-[place] on arrival-Npo after Mn.

## 2017-03-26 ENCOUNTER — Ambulatory Visit (HOSPITAL_BASED_OUTPATIENT_CLINIC_OR_DEPARTMENT_OTHER): Admission: RE | Admit: 2017-03-26 | Payer: 59 | Source: Ambulatory Visit | Admitting: Urology

## 2017-03-26 HISTORY — DX: Type 2 diabetes mellitus without complications: E11.9

## 2017-03-26 HISTORY — DX: Benign prostatic hyperplasia without lower urinary tract symptoms: N40.0

## 2017-03-26 HISTORY — DX: Hyperlipidemia, unspecified: E78.5

## 2017-03-26 HISTORY — DX: Bladder-neck obstruction: N32.0

## 2017-03-26 SURGERY — CYSTOSCOPY WITH INSERTION OF UROLIFT
Anesthesia: Choice

## 2017-04-27 ENCOUNTER — Encounter (INDEPENDENT_AMBULATORY_CARE_PROVIDER_SITE_OTHER): Payer: Self-pay | Admitting: *Deleted

## 2017-06-09 ENCOUNTER — Encounter: Payer: Self-pay | Admitting: General Practice

## 2017-08-13 DIAGNOSIS — E7849 Other hyperlipidemia: Secondary | ICD-10-CM | POA: Diagnosis not present

## 2017-08-13 DIAGNOSIS — I10 Essential (primary) hypertension: Secondary | ICD-10-CM | POA: Diagnosis not present

## 2017-08-13 DIAGNOSIS — E1142 Type 2 diabetes mellitus with diabetic polyneuropathy: Secondary | ICD-10-CM | POA: Diagnosis not present

## 2017-08-14 DIAGNOSIS — E1142 Type 2 diabetes mellitus with diabetic polyneuropathy: Secondary | ICD-10-CM | POA: Diagnosis not present

## 2017-08-14 DIAGNOSIS — I10 Essential (primary) hypertension: Secondary | ICD-10-CM | POA: Diagnosis not present

## 2017-08-14 DIAGNOSIS — E7849 Other hyperlipidemia: Secondary | ICD-10-CM | POA: Diagnosis not present

## 2017-08-26 ENCOUNTER — Encounter (INDEPENDENT_AMBULATORY_CARE_PROVIDER_SITE_OTHER): Payer: Self-pay | Admitting: *Deleted

## 2017-08-29 IMAGING — DX DG CHEST 2V
2 series · 2 of 2 positions shown · non-contrast
Comparison: None.

CLINICAL DATA: Fever, malaise, SOB since [REDACTED]

EXAM:
CHEST  2 VIEW

[chest pa]
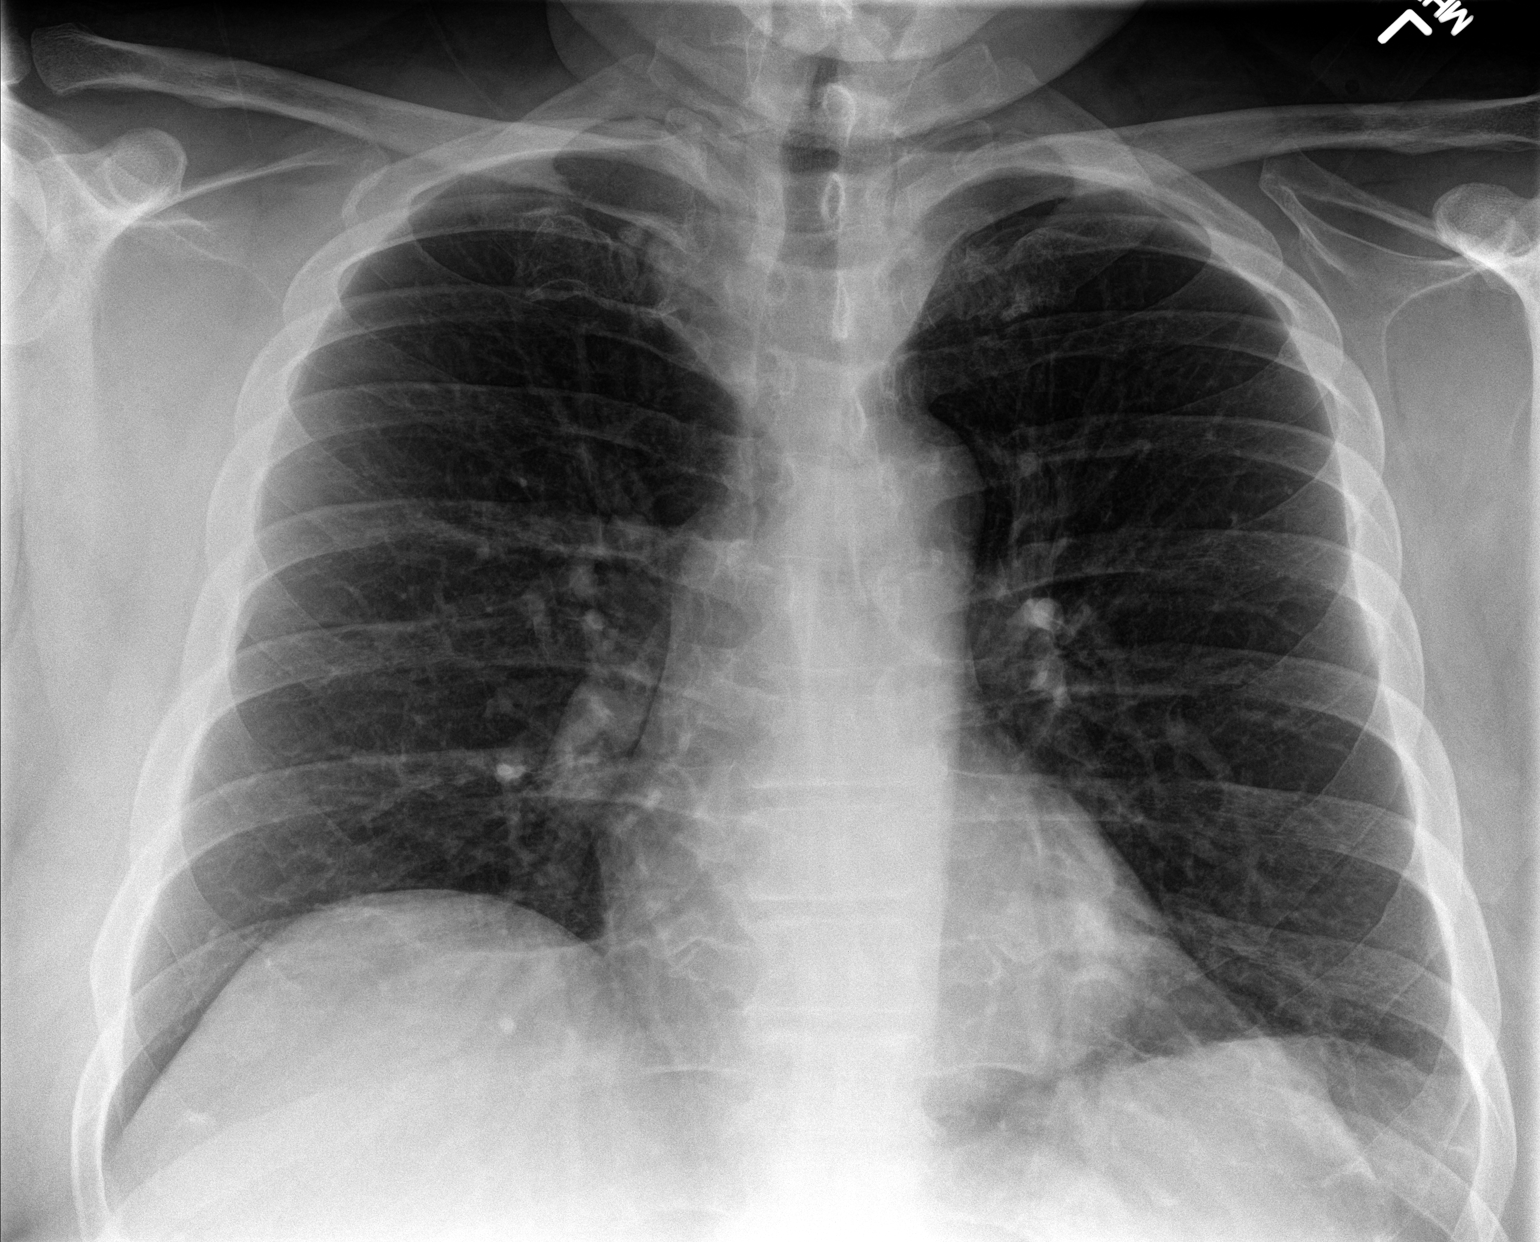

[chest lat]
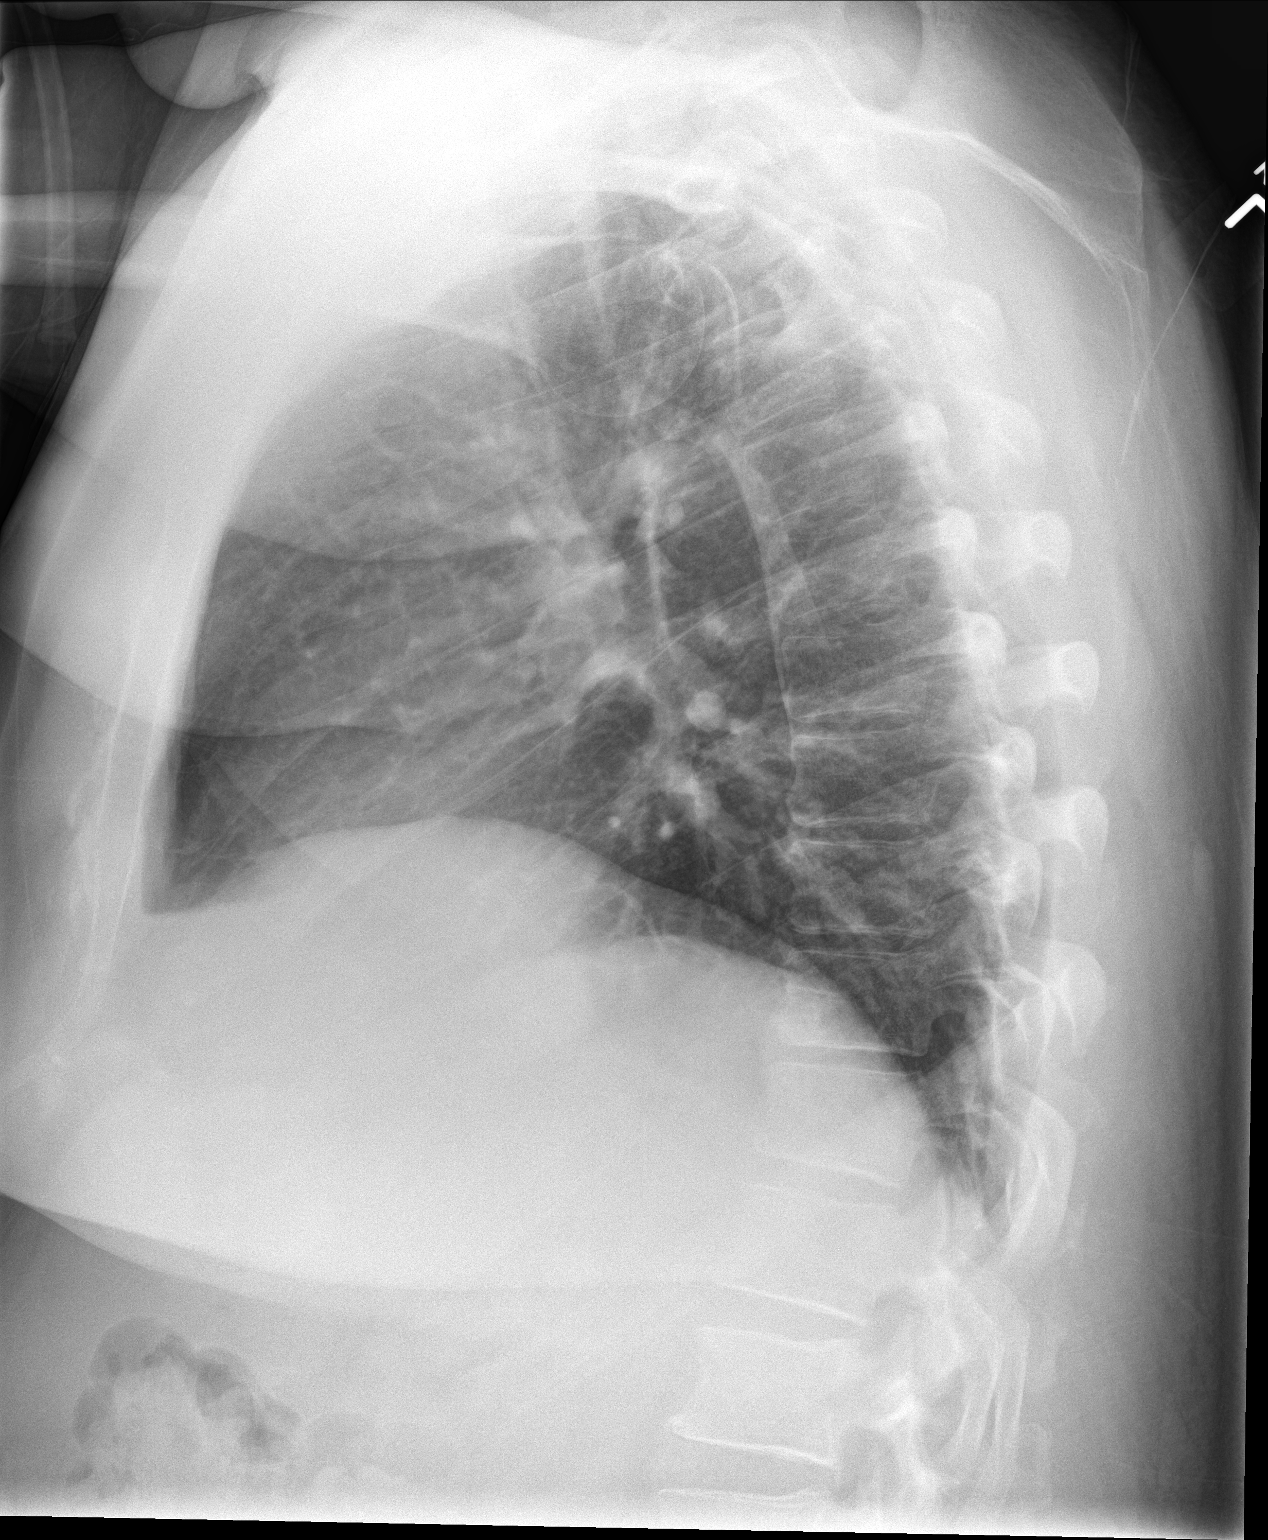

[2 of 2 positions shown; findings below may reference images not displayed]

FINDINGS: The heart size and mediastinal contours are within normal limits.
Both lungs are clear. No pleural effusion or pneumothorax. The
visualized skeletal structures are unremarkable.
IMPRESSION: No active cardiopulmonary disease.

## 2017-08-31 IMAGING — CR DG CHEST 1V PORT
1 series · 1 of 1 positions shown · non-contrast
Comparison: 03/15/2016

CLINICAL DATA: Sepsis.

EXAM:
PORTABLE CHEST 1 VIEW

[ap portable]
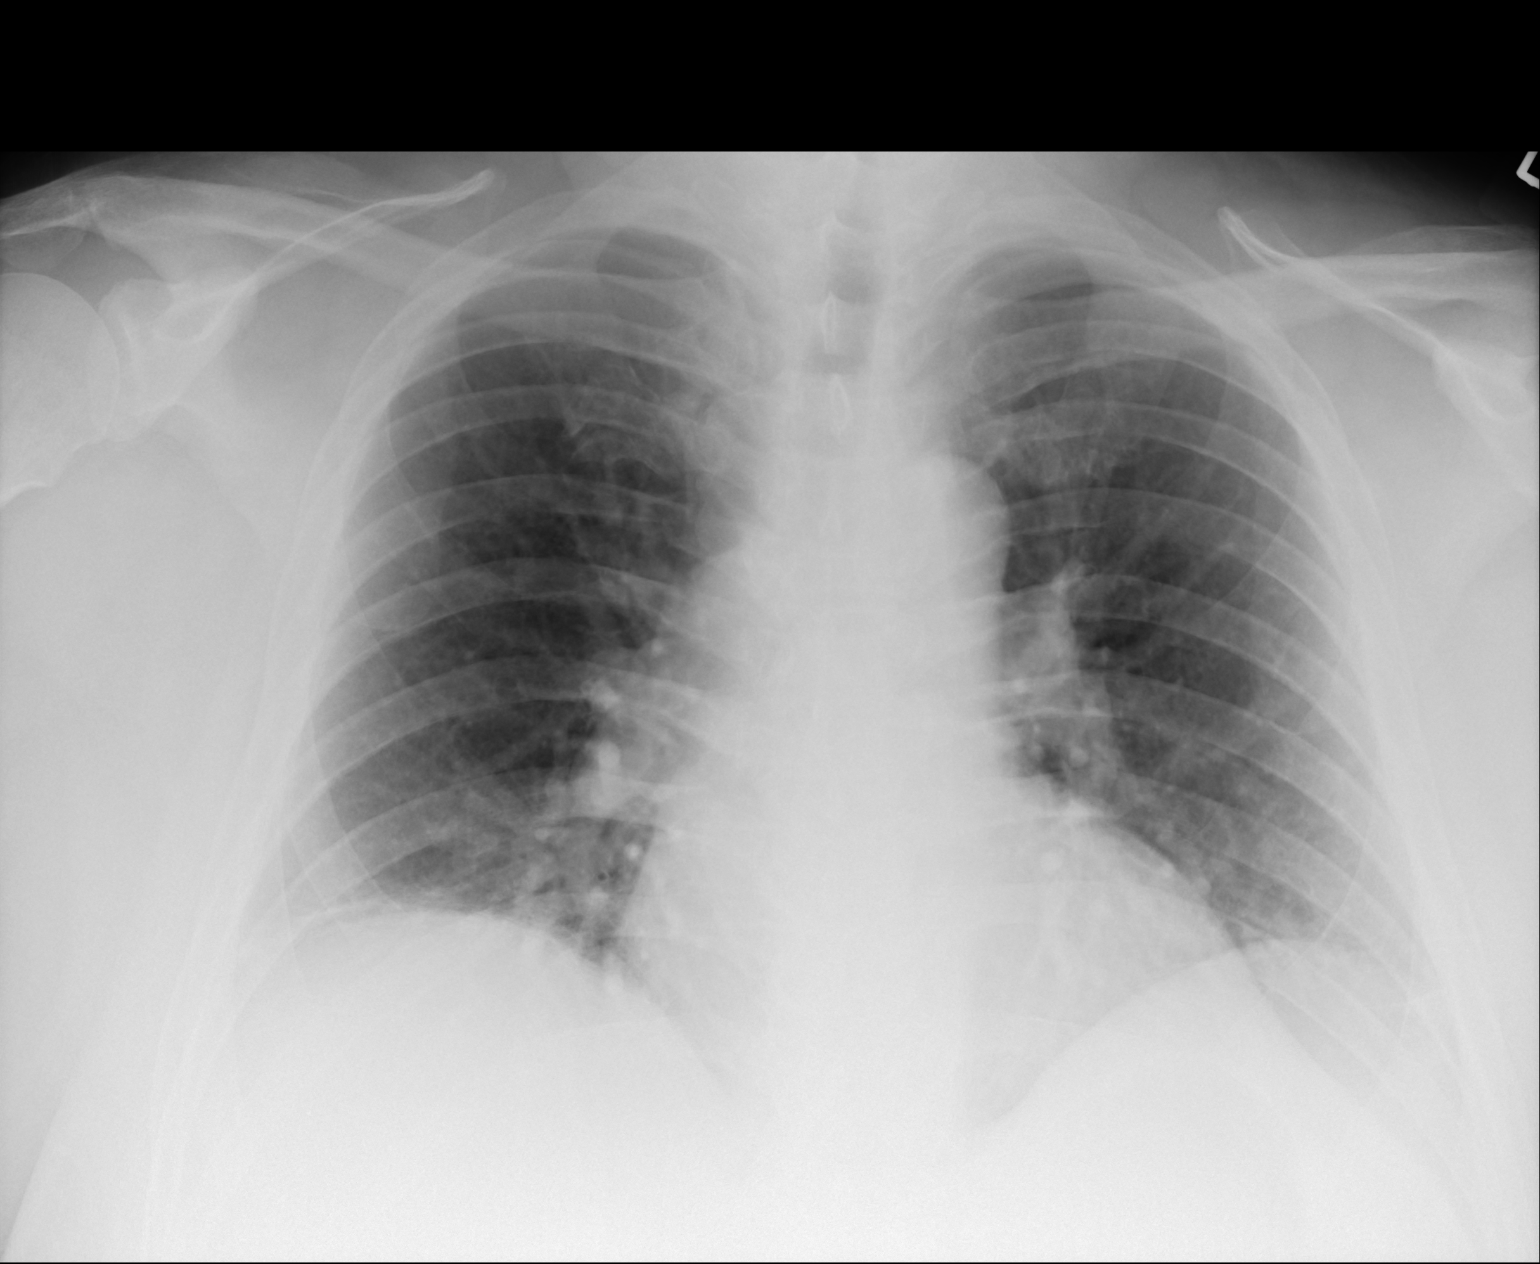

[1 of 1 positions shown; findings below may reference images not displayed]

FINDINGS: Shallow inspiration with atelectasis in the lung bases. No focal
consolidation. Normal heart size and pulmonary vascularity. No
blunting of costophrenic angles. No pneumothorax. Mediastinal
contours appear intact.
IMPRESSION: Shallow inspiration with atelectasis in the lung bases.

## 2017-11-10 DIAGNOSIS — E7849 Other hyperlipidemia: Secondary | ICD-10-CM | POA: Diagnosis not present

## 2017-11-10 DIAGNOSIS — I10 Essential (primary) hypertension: Secondary | ICD-10-CM | POA: Diagnosis not present

## 2017-11-10 DIAGNOSIS — E1142 Type 2 diabetes mellitus with diabetic polyneuropathy: Secondary | ICD-10-CM | POA: Diagnosis not present

## 2017-11-12 ENCOUNTER — Encounter: Payer: Self-pay | Admitting: Gastroenterology

## 2017-12-30 ENCOUNTER — Ambulatory Visit: Payer: 59

## 2018-01-07 ENCOUNTER — Ambulatory Visit: Payer: 59

## 2018-01-13 ENCOUNTER — Ambulatory Visit: Payer: Self-pay

## 2018-01-13 ENCOUNTER — Ambulatory Visit: Payer: 59

## 2018-02-03 DIAGNOSIS — Z0000 Encounter for general adult medical examination without abnormal findings: Secondary | ICD-10-CM | POA: Diagnosis not present

## 2018-02-11 ENCOUNTER — Ambulatory Visit (INDEPENDENT_AMBULATORY_CARE_PROVIDER_SITE_OTHER): Payer: Self-pay

## 2018-02-11 DIAGNOSIS — Z1211 Encounter for screening for malignant neoplasm of colon: Secondary | ICD-10-CM

## 2018-02-11 MED ORDER — NA SULFATE-K SULFATE-MG SULF 17.5-3.13-1.6 GM/177ML PO SOLN: 1.0000 | ORAL | 0 refills | Status: DC

## 2018-02-11 NOTE — Patient Instructions (Signed)
Bradley Hurst  May 03, 1961 MRN: [id number]     Procedure Date: 04/23/18 Time to register: 7:30am Place to register: [facility] Procedure Time: 8:30am Scheduled provider: R. Garfield Cornea, MD    PREPARATION FOR COLONOSCOPY WITH SUPREP BOWEL PREP KIT  Note: Suprep Bowel Prep Kit is a split-dose (2day) regimen. Consumption of BOTH 6-ounce bottles is required for a complete prep.  Please notify us immediately if you are diabetic, take iron supplements, or if you are on Coumadin or any other blood thinners.  Please hold the following medications: I will mail you a letter with this information.                                                                                                                                                   2 DAYS BEFORE PROCEDURE:  DATE: 04/21/18   DAY: Wednesday Begin clear liquid diet AFTER your lunch meal. NO SOLID FOODS after this point.  1 DAY BEFORE PROCEDURE:  DATE: 04/22/18  DAY: Thursday Continue clear liquids the entire day - NO SOLID FOOD.   Diabetic medications adjustments for today: see letter  At 6:00pm: Complete steps 1 through 4 below, using ONE (1) 6-ounce bottle, before going to bed. Step 1:  Pour ONE (1) 6-ounce bottle of SUPREP liquid into the mixing container.  Step 2:  Add cool drinking water to the 16 ounce line on the container and mix.  Note: Dilute the solution concentrate as directed prior to use. Step 3:  DRINK ALL the liquid in the container. Step 4:  You MUST drink an additional two (2) or more 16 ounce containers of water over the next one (1) hour.   Continue clear liquids.  DAY OF PROCEDURE:   DATE: 04/23/18   DAY: Friday If you take medications for your heart, blood pressure, or breathing, you may take these medications.  Diabetic medications adjustments for today:see letter  5 hours before your procedure at 3:30am: Step 1:  Pour ONE (1) 6-ounce bottle of SUPREP liquid into the mixing container.  Step 2:  Add  cool drinking water to the 16 ounce line on the container and mix.  Note: Dilute the solution concentrate as directed prior to use. Step 3:  DRINK ALL the liquid in the container. Step 4:  You MUST drink an additional two (2) or more 16 ounce containers of water over the next one (1) hour. You MUST complete the final glass of water at least 3 hours before your colonoscopy.   Nothing by mouth past 5:30am  You may take your morning medications with sip of water unless we have instructed otherwise.    Please see below for Dietary Information.  CLEAR LIQUIDS INCLUDE:  Water Jello (NOT red in color)   Ice Popsicles (NOT red in color)   Tea (sugar ok, no milk/cream) Powdered  fruit flavored drinks  Coffee (sugar ok, no milk/cream) Gatorade/ Lemonade/ Kool-Aid  (NOT red in color)   Juice: apple, white grape, white cranberry Soft drinks  Clear bullion, consomme, broth (fat free beef/chicken/vegetable)  Carbonated beverages (any kind)  Strained chicken noodle soup Hard Candy   Remember: Clear liquids are liquids that will allow you to see your fingers on the other side of a clear glass. Be sure liquids are NOT red in color, and not cloudy, but CLEAR.  DO NOT EAT OR DRINK ANY OF THE FOLLOWING:  Dairy products of any kind   Cranberry juice Tomato juice / V8 juice   Grapefruit juice Orange juice     Red grape juice  Do not eat any solid foods, including such foods as: cereal, oatmeal, yogurt, fruits, vegetables, creamed soups, eggs, bread, crackers, pureed foods in a blender, etc.   HELPFUL HINTS FOR DRINKING PREP SOLUTION:   Make sure prep is extremely cold. Mix and refrigerate the the morning of the prep. You may also put in the freezer.   You may try mixing some Crystal Light or Country Time Lemonade if you prefer. Mix in small amounts; add more if necessary.  Try drinking through a straw  Rinse mouth with water or a mouthwash between glasses, to remove after-taste.  Try sipping on a  cold beverage /ice/ popsicles between glasses of prep.  Place a piece of sugar-free hard candy in mouth between glasses.  If you become nauseated, try consuming smaller amounts, or stretch out the time between glasses. Stop for 30-60 minutes, then slowly start back drinking.     OTHER INSTRUCTIONS  You will need a responsible adult at least 58 years of age to accompany you and drive you home. This person must remain in the waiting room during your procedure. The hospital will cancel your procedure if you do not have a responsible adult with you.   1. Wear loose fitting clothing that is easily removed. 2. Leave jewelry and other valuables at home.  3. Remove all body piercing jewelry and leave at home. 4. Total time from sign-in until discharge is approximately 2-3 hours. 5. You should go home directly after your procedure and rest. You can resume normal activities the day after your procedure. 6. The day of your procedure you should not:  Drive  Make legal decisions  Operate machinery  Drink alcohol  Return to work   You may call the office (Dept: [phone]) before 5:00pm, or page the doctor on call [phone]) after 5:00pm, for further instructions, if necessary.   Insurance Information YOU WILL NEED TO CHECK WITH YOUR INSURANCE COMPANY FOR THE BENEFITS OF COVERAGE YOU HAVE FOR THIS PROCEDURE.  UNFORTUNATELY, NOT ALL INSURANCE COMPANIES HAVE BENEFITS TO COVER ALL OR PART OF THESE TYPES OF PROCEDURES.  IT IS YOUR RESPONSIBILITY TO CHECK YOUR BENEFITS, HOWEVER, WE WILL BE GLAD TO ASSIST YOU WITH ANY CODES YOUR INSURANCE COMPANY MAY NEED.    PLEASE NOTE THAT MOST INSURANCE COMPANIES WILL NOT COVER A SCREENING COLONOSCOPY FOR PEOPLE UNDER THE AGE OF 50  IF YOU HAVE BCBS INSURANCE, YOU MAY HAVE BENEFITS FOR A SCREENING COLONOSCOPY BUT IF POLYPS ARE FOUND THE DIAGNOSIS WILL CHANGE AND THEN YOU MAY HAVE A DEDUCTIBLE THAT WILL NEED TO BE MET. SO PLEASE MAKE SURE YOU CHECK YOUR  BENEFITS FOR A SCREENING COLONOSCOPY AS WELL AS A DIAGNOSTIC COLONOSCOPY.

## 2018-02-11 NOTE — Progress Notes (Signed)
Gastroenterology Pre-Procedure Review  Request Date:02/11/18 Requesting Physician: Dr.Hasanaj (no previous tcs)  PATIENT REVIEW QUESTIONS: The patient responded to the following health history questions as indicated:    1. Diabetes Melitis: yes (jardiance, metformin, januvia, trulicity) 2. Joint replacements in the past 12 months: no 3. Major health problems in the past 3 months: no 4. Has an artificial valve or MVP: no 5. Has a defibrillator: no 6. Has been advised in past to take antibiotics in advance of a procedure like teeth cleaning: no 7. Family history of colon cancer: no  8. Alcohol Use: no 9. History of sleep apnea: yes (mild no cpap)  10. History of coronary artery or other vascular stents placed within the last 12 months: no 11. History of any prior anesthesia complications: no    MEDICATIONS & ALLERGIES:    Patient reports the following regarding taking any blood thinners:   Plavix? no Aspirin? no Coumadin? no Brilinta? no Xarelto? no Eliquis? no Pradaxa? no Savaysa? no Effient? no  Patient confirms/reports the following medications:  Current Outpatient Medications  Medication Sig Dispense Refill  . amLODipine (NORVASC) 10 MG tablet Take by mouth.    . empagliflozin (JARDIANCE) 25 MG TABS tablet Take 25 mg by mouth daily.    Marland Kitchen ezetimibe (ZETIA) 10 MG tablet daily.    Marland Kitchen losartan (COZAAR) 100 MG tablet Take 100 mg by mouth every evening.    . metFORMIN (GLUCOPHAGE) 1000 MG tablet Take 1,000 mg by mouth 2 (two) times daily with a meal.    . omega-3 acid ethyl esters (LOVAZA) 1 g capsule Take 1 g by mouth 2 (two) times daily.    . Potassium Chloride CR (MICRO-K) 8 MEQ CPCR capsule CR Take 8 mEq by mouth at bedtime.    . sitaGLIPtin (JANUVIA) 100 MG tablet Take 100 mg by mouth daily.    Marland Kitchen topiramate (TOPAMAX) 50 MG tablet 2 (two) times daily.    . TRULICITY 3.24 MW/1.0UV SOPN once a week.     No current facility-administered medications for this visit.      Patient confirms/reports the following allergies:  No Known Allergies  No orders of the defined types were placed in this encounter.   AUTHORIZATION INFORMATION Primary Insurance: [facility],  [place] #: [id number] Pre-Cert / Josem Kaufmann required:  Pre-Cert / Auth #:    SCHEDULE INFORMATION: Procedure has been scheduled as follows:  Date: 04/23/18, Time: 8:30 Location: [facility] Dr.Rourk  This Gastroenterology Pre-Precedure Review Form is being routed to the following provider(s): EG

## 2018-02-12 DIAGNOSIS — Z0000 Encounter for general adult medical examination without abnormal findings: Secondary | ICD-10-CM | POA: Diagnosis not present

## 2018-02-12 NOTE — Progress Notes (Signed)
Ok to schedule.  DM meds: half night before and none morning of.  On prep day: Check CBG ac and hs as well as if the patient feels like their blood sugar is off. Can use soda, juice (that's in CLEAR LIQUIDS) as needed for any low blood sugar.  Check CBG on arrival to endo unit. 

## 2018-02-15 NOTE — Progress Notes (Signed)
Letter mailed to the pt with DM medication instructions.  

## 2018-04-09 ENCOUNTER — Telehealth: Payer: Self-pay | Admitting: Internal Medicine

## 2018-04-09 MED ORDER — NA SULFATE-K SULFATE-MG SULF 17.5-3.13-1.6 GM/177ML PO SOLN: 1.0000 | ORAL | 0 refills | Status: DC

## 2018-04-09 NOTE — Telephone Encounter (Signed)
PATIENT SAID THE PHARMACY DOES NOT HAVE HIS PREP, IT WAS CONFIRMED BY THEM IN July.  CAN YOU PLEASE CALL IT IN AGAIN AND HE SAID HE WILL CHECK BACK IN WITH THEM TODAY SO HE CAN PICK IT UP

## 2018-04-09 NOTE — Telephone Encounter (Signed)
suprep has been sent to the pharmacy.

## 2018-04-09 NOTE — Addendum Note (Signed)
Addended by: Inge Rise on: 04/09/2018 11:39 AM   Modules accepted: Orders

## 2018-04-23 ENCOUNTER — Encounter (HOSPITAL_COMMUNITY)
Admission: RE | Disposition: A | Discharge status: Home / Self Care | Payer: Self-pay | Source: Ambulatory Visit | Attending: Internal Medicine

## 2018-04-23 ENCOUNTER — Other Ambulatory Visit: Payer: Self-pay

## 2018-04-23 ENCOUNTER — Encounter (HOSPITAL_COMMUNITY): Payer: Self-pay | Admitting: *Deleted

## 2018-04-23 ENCOUNTER — Ambulatory Visit (HOSPITAL_COMMUNITY)
Admission: RE | Admit: 2018-04-23 | Discharge: 2018-04-23 | Disposition: A | Discharge status: Home / Self Care | Payer: 59 | Source: Ambulatory Visit | Attending: Internal Medicine | Admitting: Internal Medicine

## 2018-04-23 DIAGNOSIS — Z7984 Long term (current) use of oral hypoglycemic drugs: Secondary | ICD-10-CM | POA: Diagnosis not present

## 2018-04-23 DIAGNOSIS — Z1211 Encounter for screening for malignant neoplasm of colon: Secondary | ICD-10-CM | POA: Insufficient documentation

## 2018-04-23 DIAGNOSIS — E785 Hyperlipidemia, unspecified: Secondary | ICD-10-CM | POA: Diagnosis not present

## 2018-04-23 DIAGNOSIS — E119 Type 2 diabetes mellitus without complications: Secondary | ICD-10-CM | POA: Insufficient documentation

## 2018-04-23 DIAGNOSIS — I10 Essential (primary) hypertension: Secondary | ICD-10-CM | POA: Diagnosis not present

## 2018-04-23 DIAGNOSIS — N400 Enlarged prostate without lower urinary tract symptoms: Secondary | ICD-10-CM | POA: Diagnosis not present

## 2018-04-23 DIAGNOSIS — Z79899 Other long term (current) drug therapy: Secondary | ICD-10-CM | POA: Insufficient documentation

## 2018-04-23 DIAGNOSIS — D125 Benign neoplasm of sigmoid colon: Secondary | ICD-10-CM | POA: Diagnosis not present

## 2018-04-23 DIAGNOSIS — K635 Polyp of colon: Secondary | ICD-10-CM | POA: Diagnosis not present

## 2018-04-23 HISTORY — PX: POLYPECTOMY: SHX5525

## 2018-04-23 HISTORY — PX: COLONOSCOPY: SHX5424

## 2018-04-23 LAB — GLUCOSE, CAPILLARY: GLUCOSE-CAPILLARY: 193 mg/dL — AB (ref 70–99)

## 2018-04-23 SURGERY — COLONOSCOPY
Anesthesia: Moderate Sedation

## 2018-04-23 MED ORDER — MEPERIDINE HCL 50 MG/ML IJ SOLN
INTRAMUSCULAR | Status: AC
  Filled 2018-04-23: qty 1

## 2018-04-23 MED ORDER — ONDANSETRON HCL 4 MG/2ML IJ SOLN
INTRAMUSCULAR | Status: DC | PRN
  Administered 2018-04-23: 4 mg via INTRAVENOUS

## 2018-04-23 MED ORDER — ONDANSETRON HCL 4 MG/2ML IJ SOLN
INTRAMUSCULAR | Status: AC
  Filled 2018-04-23: qty 2

## 2018-04-23 MED ORDER — MIDAZOLAM HCL 5 MG/5ML IJ SOLN
INTRAMUSCULAR | Status: AC
  Filled 2018-04-23: qty 5

## 2018-04-23 MED ORDER — SODIUM CHLORIDE 0.9 % IV SOLN
INTRAVENOUS | Status: DC
  Administered 2018-04-23: 08:00:00 via INTRAVENOUS

## 2018-04-23 MED ORDER — MIDAZOLAM HCL 5 MG/5ML IJ SOLN
INTRAMUSCULAR | Status: DC | PRN
  Administered 2018-04-23 (×3): 1 mg via INTRAVENOUS

## 2018-04-23 MED ORDER — MEPERIDINE HCL 100 MG/ML IJ SOLN
INTRAMUSCULAR | Status: DC | PRN
  Administered 2018-04-23: 25 mg via INTRAVENOUS

## 2018-04-23 NOTE — H&P (Signed)
'@LOGO' @   Primary Care Physician:  [name], MD Primary Gastroenterologist:  Dr. Gala Romney  Pre-Procedure History & Physical: HPI:  Bradley Hurst is a 57 y.o. male is here for a screening colonoscopy. First ever screening examination. No bowel symptoms. No family history of colon cancer.  Past Medical History:  Diagnosis Date  . Bladder outlet obstruction   . BPH (benign prostatic hyperplasia)   . Hyperlipidemia   . Hypertension   . Type 2 diabetes mellitus ([place])     Past Surgical History:  Procedure Laterality Date  . CIRCUMCISION  2017    Prior to Admission medications   Medication Sig Start Date End Date Taking? Authorizing Provider  amLODipine (NORVASC) 10 MG tablet Take 10 mg by mouth daily.    Yes [provider]  empagliflozin (JARDIANCE) 25 MG TABS tablet Take 25 mg by mouth daily.   Yes [provider]  ezetimibe (ZETIA) 10 MG tablet Take 10 mg by mouth daily.  02/06/18  Yes [provider]  losartan-hydrochlorothiazide (HYZAAR) 100-25 MG tablet Take 1 tablet by mouth daily.   Yes [provider]  metFORMIN (GLUCOPHAGE) 1000 MG tablet Take 1,000 mg by mouth 2 (two) times daily with a meal.   Yes [provider]  metoprolol succinate (TOPROL-XL) 25 MG 24 hr tablet Take 25 mg by mouth daily.   Yes [provider]  Na Sulfate-K Sulfate-Mg Sulf 17.5-3.13-1.6 GM/177ML SOLN Take 1 kit by mouth as directed. 04/09/18  Yes Johara Lodwick, Cristopher Estimable, MD  omega-3 acid ethyl esters (LOVAZA) 1 g capsule Take 1 g by mouth 2 (two) times daily.   Yes [provider]  Potassium Chloride CR (MICRO-K) 8 MEQ CPCR capsule CR Take 8 mEq by mouth at bedtime.   Yes [provider]  tadalafil (CIALIS) 5 MG tablet Take 5 mg by mouth daily.   Yes [provider]  topiramate (TOPAMAX) 50 MG tablet Take 50 mg by mouth 2 (two) times daily.  02/08/18  Yes [provider]  TRULICITY 3.81 WE/9.9BZ SOPN Inject 0.75 mg into the skin  every Friday.  02/03/18  Yes [provider]  Na Sulfate-K Sulfate-Mg Sulf (SUPREP BOWEL PREP KIT) 17.5-3.13-1.6 GM/177ML SOLN Take 1 kit by mouth as directed. Patient not taking: Reported on 04/21/2018 02/11/18   Carlis Stable, NP    Allergies as of 02/11/2018  . (No Known Allergies)    History reviewed. No pertinent family history.  Social History   Socioeconomic History  . Marital status: Married    Spouse name: Not on file  . Number of children: Not on file  . Years of education: Not on file  . Highest education level: Not on file  Occupational History  . Not on file  Social Needs  . Financial resource strain: Not on file  . Food insecurity:    Worry: Not on file    Inability: Not on file  . Transportation needs:    Medical: Not on file    Non-medical: Not on file  Tobacco Use  . Smoking status: Never Smoker  . Smokeless tobacco: Never Used  Substance and Sexual Activity  . Alcohol use: No  . Drug use: No  . Sexual activity: Not on file  Lifestyle  . Physical activity:    Days per week: Not on file    Minutes per session: Not on file  . Stress: Not on file  Relationships  . Social connections:    Talks on phone: Not  on file    Gets together: Not on file    Attends religious service: Not on file    Active member of club or organization: Not on file    Attends meetings of clubs or organizations: Not on file    Relationship status: Not on file  . Intimate partner violence:    Fear of current or ex partner: Not on file    Emotionally abused: Not on file    Physically abused: Not on file    Forced sexual activity: Not on file  Other Topics Concern  . Not on file  Social History Narrative  . Not on file    Review of Systems: See HPI, otherwise negative ROS  Physical Exam: BP 105/70   Temp 97.6 F (36.4 C) (Oral)   Resp 13   Ht 5' 9.5" (1.765 m)   Wt 121.6 kg   SpO2 97%   BMI 39.01 kg/m  General:   Alert,  Well-developed, well-nourished,  pleasant and cooperative in NAD Lungs:  Clear throughout to auscultation.   No wheezes, crackles, or rhonchi. No acute distress. Heart:  Regular rate and rhythm; no murmurs, clicks, rubs,  or gallops. Abdomen:  Soft, nontender and nondistended. No masses, hepatosplenomegaly or hernias noted. Normal bowel sounds, without guarding, and without rebound.    Impression/Plan: Bradley Hurst is now here to undergo a screening colonoscopy.  First-ever average risk screening examination.  Risks, benefits, limitations, imponderables and alternatives regarding colonoscopy have been reviewed with the patient. Questions have been answered. All parties agreeable.     Notice:  This dictation was prepared with Dragon dictation along with smaller phrase technology. Any transcriptional errors that result from this process are unintentional and may not be corrected upon review.

## 2018-04-23 NOTE — Discharge Instructions (Signed)
Colonoscopy Discharge Instructions  Read the instructions outlined below and refer to this sheet in the next few weeks. These discharge instructions provide you with general information on caring for yourself after you leave the hospital. Your doctor may also give you specific instructions. While your treatment has been planned according to the most current medical practices available, unavoidable complications occasionally occur. If you have any problems or questions after discharge, call Dr. Gala Romney at [phone].  After hours and weekends call the hospital and have the GI doctor on call paged; they will call you back. ACTIVITY  You may resume your regular activity, but move at a slower pace for the next 24 hours.   Take frequent rest periods for the next 24 hours.   Walking will help get rid of the air and reduce the bloated feeling in your belly (abdomen).   No driving for 24 hours (because of the medicine (anesthesia) used during the test).    Do not sign any important legal documents or operate any machinery for 24 hours (because of the anesthesia used during the test).  NUTRITION  Drink plenty of fluids.   You may resume your normal diet as instructed by your doctor.   Begin with a light meal and progress to your normal diet. Heavy or fried foods are harder to digest and may make you feel sick to your stomach (nauseated).   Avoid alcoholic beverages for 24 hours or as instructed.  MEDICATIONS  You may resume your normal medications unless your doctor tells you otherwise.  WHAT YOU CAN EXPECT TODAY  Some feelings of bloating in the abdomen.   Passage of more gas than usual.   Spotting of blood in your stool or on the toilet paper.  IF YOU HAD POLYPS REMOVED DURING THE COLONOSCOPY:  No aspirin products for 7 days or as instructed.   No alcohol for 7 days or as instructed.   Eat a soft diet for the next 24 hours.  FINDING OUT THE RESULTS OF YOUR TEST Not all test  results are available during your visit. If your test results are not back during the visit, make an appointment with your caregiver to find out the results. Do not assume everything is normal if you have not heard from your caregiver or the medical facility. It is important for you to follow up on all of your test results.  SEEK IMMEDIATE MEDICAL ATTENTION IF:  You have more than a spotting of blood in your stool.   Your belly is swollen (abdominal distention).   You are nauseated or vomiting.   You have a temperature over 101.   You have abdominal pain or discomfort that is severe or gets worse throughout the day.    Polyp information provided  Further recommendations to follow pending review of pathology report   Colon Polyps Polyps are tissue growths inside the body. Polyps can grow in many places, including the large intestine (colon). A polyp may be a round bump or a mushroom-shaped growth. You could have one polyp or several. Most colon polyps are noncancerous (benign). However, some colon polyps can become cancerous over time. What are the causes? The exact cause of colon polyps is not known. What increases the risk? This condition is more likely to develop in people who:  Have a family history of colon cancer or colon polyps.  Are older than 61 or older than 45 if they are African American.  Have inflammatory bowel disease, such as ulcerative  colitis or Crohn disease.  Are overweight.  Smoke cigarettes.  Do not get enough exercise.  Drink too much alcohol.  Eat a diet that is: ? High in fat and red meat. ? Low in fiber.  Had childhood cancer that was treated with abdominal radiation.  What are the signs or symptoms? Most polyps do not cause symptoms. If you have symptoms, they may include:  Blood coming from your rectum when having a bowel movement.  Blood in your stool.The stool may look dark red or black.  A change in bowel habits, such as  constipation or diarrhea.  How is this diagnosed? This condition is diagnosed with a colonoscopy. This is a procedure that uses a lighted, flexible scope to look at the inside of your colon. How is this treated? Treatment for this condition involves removing any polyps that are found. Those polyps will then be tested for cancer. If cancer is found, your health care provider will talk to you about options for colon cancer treatment. Follow these instructions at home: Diet  Eat plenty of fiber, such as fruits, vegetables, and whole grains.  Eat foods that are high in calcium and vitamin D, such as milk, cheese, yogurt, eggs, liver, fish, and broccoli.  Limit foods high in fat, red meats, and processed meats, such as hot dogs, sausage, bacon, and lunch meats.  Maintain a healthy weight, or lose weight if recommended by your health care provider. General instructions  Do not smoke cigarettes.  Do not drink alcohol excessively.  Keep all follow-up visits as told by your health care provider. This is important. This includes keeping regularly scheduled colonoscopies. Talk to your health care provider about when you need a colonoscopy.  Exercise every day or as told by your health care provider. Contact a health care provider if:  You have new or worsening bleeding during a bowel movement.  You have new or increased blood in your stool.  You have a change in bowel habits.  You unexpectedly lose weight. This information is not intended to replace advice given to you by your health care provider. Make sure you discuss any questions you have with your health care provider. Document Released: 04/16/2004 Document Revised: 12/27/2015 Document Reviewed: 06/11/2015 Elsevier Interactive Patient Education  [employer].

## 2018-04-23 NOTE — Op Note (Signed)
[facility] Patient Name: Bradley Hurst Procedure Date: 04/23/2018 8:24 AM MRN: [id number] Date of Birth: 10/13/1960 Attending MD: Norvel Richards , MD CSN: [id number] Age: 57 Admit Type: Outpatient Procedure:                Colonoscopy Indications:              Screening for colorectal malignant neoplasm Providers:                Norvel Richards, MD, Janeece Riggers, RN, Nelma Rothman, Technician Referring MD:              Medicines:                Midazolam 3 mg IV, Meperidine 25 mg IV, Ondansetron                            4 mg IV Complications:            No immediate complications. Estimated Blood Loss:     Estimated blood loss was minimal. Procedure:                Pre-Anesthesia Assessment:                           - Prior to the procedure, a History and Physical                            was performed, and patient medications and                            allergies were reviewed. The patient's tolerance of                            previous anesthesia was also reviewed. The risks                            and benefits of the procedure and the sedation                            options and risks were discussed with the patient.                            All questions were answered, and informed consent                            was obtained. Prior Anticoagulants: The patient has                            taken no previous anticoagulant or antiplatelet                            agents. ASA Grade Assessment: II - A patient with  mild systemic disease. After reviewing the risks                            and benefits, the patient was deemed in                            satisfactory condition to undergo the procedure.                           After obtaining informed consent, the colonoscope                            was passed under direct vision. Throughout the                            procedure, the  patient's blood pressure, pulse, and                            oxygen saturations were monitored continuously. The                            CF-[id number] ([id number]) scope was introduced through                            the anus and advanced to the the cecum, identified                            by appendiceal orifice and ileocecal valve. The                            colonoscopy was performed without difficulty. The                            patient tolerated the procedure well. The quality                            of the bowel preparation was adequate. The                            ileocecal valve, appendiceal orifice, and rectum                            were photographed. The quality of the bowel                            preparation was adequate. The ileocecal valve,                            appendiceal orifice, and rectum were photographed.                            The ileocecal valve, appendiceal orifice, and  rectum were photographed. Scope In: 8:33:27 AM Scope Out: 8:48:22 AM Scope Withdrawal Time: 0 hours 11 minutes 1 second  Total Procedure Duration: 0 hours 14 minutes 55 seconds  Findings:      The perianal and digital rectal examinations were normal.      A 5 mm polyp was found in the sigmoid colon. The polyp was sessile. The       polyp was removed with a cold snare. Resection and retrieval were       complete. Estimated blood loss was minimal.      The exam was otherwise without abnormality on direct and retroflexion       views. Impression:               - One 5 mm polyp in the sigmoid colon, removed with                            a cold snare. Resected and retrieved.                           - The examination was otherwise normal on direct                            and retroflexion views. Moderate Sedation:      Moderate (conscious) sedation was administered by the endoscopy nurse       and supervised by the endoscopist. The  following parameters were       monitored: oxygen saturation, heart rate, blood pressure, and response       to care. Total physician intraservice time was 18 minutes. Recommendation:           - Patient has a contact number available for                            emergencies. The signs and symptoms of potential                            delayed complications were discussed with the                            patient. Return to normal activities tomorrow.                            Written discharge instructions were provided to the                            patient.                           - Resume previous diet.                           - Continue present medications.                           - Repeat colonoscopy date to be determined after  pending pathology results are reviewed for                            surveillance based on pathology results.                           - Return to GI office (date not yet determined). Procedure Code(s):        --- Professional ---                           [phone], Colonoscopy, flexible; with removal of                            tumor(s), polyp(s), or other lesion(s) by snare                            technique                           G0500, Moderate sedation services provided by the                            same physician or other qualified health care                            professional performing a gastrointestinal                            endoscopic service that sedation supports,                            requiring the presence of an independent trained                            observer to assist in the monitoring of the                            patient's level of consciousness and physiological                            status; initial 15 minutes of intra-service time;                            patient age 49 years or older (additional time may                            be reported with  [phone], as appropriate) Diagnosis Code(s):        --- Professional ---                           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 All rights reserved. The codes documented in this report are preliminary and upon coder review may  be revised to meet current compliance requirements. Cristopher Estimable. Gala Romney, MD Norvel Richards, MD 04/23/2018 8:54:55 AM  This report has been signed electronically. Number of Addenda: 0

## 2018-04-26 ENCOUNTER — Encounter: Payer: Self-pay | Admitting: Internal Medicine

## 2018-04-28 ENCOUNTER — Encounter (HOSPITAL_COMMUNITY): Payer: Self-pay | Admitting: Internal Medicine

## 2018-05-02 ENCOUNTER — Other Ambulatory Visit: Payer: Self-pay

## 2018-05-06 DIAGNOSIS — E1165 Type 2 diabetes mellitus with hyperglycemia: Secondary | ICD-10-CM | POA: Diagnosis not present

## 2018-05-06 DIAGNOSIS — I10 Essential (primary) hypertension: Secondary | ICD-10-CM | POA: Diagnosis not present

## 2018-05-06 DIAGNOSIS — E7849 Other hyperlipidemia: Secondary | ICD-10-CM | POA: Diagnosis not present

## 2018-08-03 DIAGNOSIS — E7849 Other hyperlipidemia: Secondary | ICD-10-CM | POA: Diagnosis not present

## 2018-08-03 DIAGNOSIS — I10 Essential (primary) hypertension: Secondary | ICD-10-CM | POA: Diagnosis not present

## 2018-08-03 DIAGNOSIS — Z6838 Body mass index (BMI) 38.0-38.9, adult: Secondary | ICD-10-CM | POA: Diagnosis not present

## 2018-08-03 DIAGNOSIS — Z6839 Body mass index (BMI) 39.0-39.9, adult: Secondary | ICD-10-CM | POA: Diagnosis not present

## 2018-08-03 DIAGNOSIS — E1165 Type 2 diabetes mellitus with hyperglycemia: Secondary | ICD-10-CM | POA: Diagnosis not present

## 2018-08-03 DIAGNOSIS — Z125 Encounter for screening for malignant neoplasm of prostate: Secondary | ICD-10-CM | POA: Diagnosis not present

## 2018-10-21 DIAGNOSIS — I10 Essential (primary) hypertension: Secondary | ICD-10-CM | POA: Diagnosis not present

## 2018-10-21 DIAGNOSIS — E7849 Other hyperlipidemia: Secondary | ICD-10-CM | POA: Diagnosis not present

## 2018-10-21 DIAGNOSIS — E1165 Type 2 diabetes mellitus with hyperglycemia: Secondary | ICD-10-CM | POA: Diagnosis not present

## 2018-10-21 DIAGNOSIS — Z6838 Body mass index (BMI) 38.0-38.9, adult: Secondary | ICD-10-CM | POA: Diagnosis not present

## 2018-10-21 DIAGNOSIS — G43919 Migraine, unspecified, intractable, without status migrainosus: Secondary | ICD-10-CM | POA: Diagnosis not present

## 2019-04-14 ENCOUNTER — Encounter: Payer: Self-pay | Admitting: Internal Medicine

## 2019-05-03 ENCOUNTER — Encounter: Payer: Self-pay | Admitting: Gastroenterology

## 2019-05-03 NOTE — Progress Notes (Signed)
Primary Care Physician:  [name], MD Primary GI:  Garfield Cornea, MD    Patient Location: Home  Provider Location: [place] office  Reason for Visit: Abnormal CT  Persons present on the virtual encounter, with roles: Patient, myself (provider), [place], LPN (updated meds and allergies)  Total time (minutes) spent on medical discussion: 15 minutes  Due to COVID-19, visit was conducted using [email or web address] method.  Visit was requested by patient.  Chief Complaint  Patient presents with  . abnormal CT    has some pain with urination     Virtual Visit via [email or web address]  I connected with [name] on 05/04/19 at  8:00 AM EDT by [email or web address] and verified that I am speaking with the correct person using two identifiers.   I discussed the limitations, risks, security and privacy concerns of performing an evaluation and management service by telephone/video and the availability of in person appointments. I also discussed with the patient that there may be a patient responsible charge related to this service. The patient expressed understanding and agreed to proceed.   HPI:   Bradley Hurst is a 58 y.o. male who presents for virtual visit regarding abnormal CT. Patient had a CT abdomen pelvis without contrast on April 01, 2019 for microscopic hematuria.  Study was performed at [facility].  There was a question of mild rectal wall thickening versus artifact from under distention.  He also had a tiny nonobstructing calculus in the upper pole right kidney, multifactorial spinal stenosis and likely lateral recess stenosis at L3-L4.  Last seen in 04/2018 at time of colonoscopy. 12mm sigmoid polyp removed, hyperplastic. Next TCS in 10 years.   Patient denies any abdominal pain.  For years he has had 1-2 bowel movements daily, sometimes loose.  Does have solid stools as well.  Believes this is side effect of medication, metformin.  No nocturnal diarrhea.  No melena or rectal bleeding.  No nausea or  vomiting.  No heartburn.  Predominant complaints of urinary retention, urinary frequency.  History of BPH.  Followed by Dr. Jeffie Pollock.  Overdue for follow-up, due to [place].   Current Outpatient Medications  Medication Sig Dispense Refill  . acarbose (PRECOSE) 100 MG tablet Take 1 tablet by mouth 3 (three) times daily.    Marland Kitchen amLODipine (NORVASC) 10 MG tablet Take 10 mg by mouth daily.     . empagliflozin (JARDIANCE) 25 MG TABS tablet Take 25 mg by mouth daily.    Marland Kitchen ezetimibe (ZETIA) 10 MG tablet Take 10 mg by mouth daily.     Marland Kitchen losartan-hydrochlorothiazide (HYZAAR) 100-25 MG tablet Take 1 tablet by mouth daily.    . metFORMIN (GLUCOPHAGE) 1000 MG tablet Take 1,000 mg by mouth 2 (two) times daily with a meal.    . metoprolol succinate (TOPROL-XL) 25 MG 24 hr tablet Take 25 mg by mouth daily.    Marland Kitchen omega-3 acid ethyl esters (LOVAZA) 1 g capsule Take 1 g by mouth 2 (two) times daily.    . Potassium Chloride CR (MICRO-K) 8 MEQ CPCR capsule CR Take 8 mEq by mouth at bedtime.    . tadalafil (CIALIS) 5 MG tablet Take 5 mg by mouth. Takes once every 3 days    . topiramate (TOPAMAX) 50 MG tablet Take 50 mg by mouth 2 (two) times daily.     . TRULICITY [id number] SOPN Inject 0.75 mg into the skin every Friday.      No current facility-administered medications for this  visit.     Past Medical History:  Diagnosis Date  . Bladder outlet obstruction   . BPH (benign prostatic hyperplasia)   . Hyperlipidemia   . Hypertension   . Type 2 diabetes mellitus ([place])     Past Surgical History:  Procedure Laterality Date  . CIRCUMCISION  2017  . COLONOSCOPY N/A 04/23/2018   Dr. Gala Romney: 64mm hyperplasitc sigmoid polyp removed. next colonoscopy in 10 years  . POLYPECTOMY  04/23/2018   Procedure: POLYPECTOMY;  Surgeon: Daneil Dolin, MD;  Location: [facility];  Service: Endoscopy;;    Family History  Problem Relation Age of Onset  . Diabetes Mother   . Heart attack Father   . Ovarian cancer Sister      Social History   Socioeconomic History  . Marital status: Married    Spouse name: Not on file  . Number of children: Not on file  . Years of education: Not on file  . Highest education level: Not on file  Occupational History  . Not on file  Social Needs  . Financial resource strain: Not on file  . Food insecurity    Worry: Not on file    Inability: Not on file  . Transportation needs    Medical: Not on file    Non-medical: Not on file  Tobacco Use  . Smoking status: Never Smoker  . Smokeless tobacco: Never Used  Substance and Sexual Activity  . Alcohol use: No  . Drug use: No  . Sexual activity: Not on file  Lifestyle  . Physical activity    Days per week: Not on file    Minutes per session: Not on file  . Stress: Not on file  Relationships  . Social Herbalist on phone: Not on file    Gets together: Not on file    Attends religious service: Not on file    Active member of club or organization: Not on file    Attends meetings of clubs or organizations: Not on file    Relationship status: Not on file  . Intimate partner violence    Fear of current or ex partner: Not on file    Emotionally abused: Not on file    Physically abused: Not on file    Forced sexual activity: Not on file  Other Topics Concern  . Not on file  Social History Narrative  . Not on file      ROS:  General: Negative for anorexia, weight loss, fever, chills, fatigue, weakness. Eyes: Negative for vision changes.  ENT: Negative for hoarseness, difficulty swallowing , nasal congestion. CV: Negative for chest pain, angina, palpitations, dyspnea on exertion, peripheral edema.  Respiratory: Negative for dyspnea at rest, dyspnea on exertion, cough, sputum, wheezing.  GI: See history of present illness. GU:  Negative for dysuria,  urinary incontinence, positive urinary frequency, positive nocturnal urination.  MS: Negative for joint pain, low back pain.  Derm: Negative for rash or  itching.  Neuro: Negative for weakness, abnormal sensation, seizure, frequent headaches, memory loss, confusion.  Psych: Negative for anxiety, depression, suicidal ideation, hallucinations.  Endo: Negative for unusual weight change.  Heme: Negative for bruising or bleeding. Allergy: Negative for rash or hives.   Observations/Objective: Pleasant, alert and oriented, Caucasian male in no acute distress  Assessment and Plan: Pleasant 58 year old gentleman presenting due to abnormal rectal wall on CT earlier this month.  There is a question of mild rectal wall thickening versus artifact from under  distention.  Patient had a colonoscopy 1 year ago as outlined above.  Denies any GI symptoms outside of chronic intermittent loose stools for years, 1 to 3/day.  I suspect CT findings is artifactual however will discuss further with Dr. Gala Romney to consider possibility of limited flexible sigmoidoscopy.  Further recommendations to follow.  Follow Up Instructions:    I discussed the assessment and treatment plan with the patient. The patient was provided an opportunity to ask questions and all were answered. The patient agreed with the plan and demonstrated an understanding of the instructions. AVS mailed to patient's home address.   The patient was advised to call back or seek an in-person evaluation if the symptoms worsen or if the condition fails to improve as anticipated.  I provided 15 minutes of virtual face-to-face time during this encounter.   Neil Crouch, PA-C

## 2019-05-04 ENCOUNTER — Ambulatory Visit (INDEPENDENT_AMBULATORY_CARE_PROVIDER_SITE_OTHER): Payer: 59 | Admitting: Gastroenterology

## 2019-05-04 ENCOUNTER — Ambulatory Visit: Payer: 59 | Admitting: Gastroenterology

## 2019-05-04 ENCOUNTER — Other Ambulatory Visit: Payer: Self-pay

## 2019-05-04 ENCOUNTER — Encounter: Payer: Self-pay | Admitting: Gastroenterology

## 2019-05-04 DIAGNOSIS — K629 Disease of anus and rectum, unspecified: Secondary | ICD-10-CM | POA: Diagnosis not present

## 2019-05-04 DIAGNOSIS — R9389 Abnormal findings on diagnostic imaging of other specified body structures: Secondary | ICD-10-CM | POA: Diagnosis not present

## 2019-05-04 NOTE — Patient Instructions (Signed)
1. We will be in touch with further recommendations once I discussed recent CT findings with Dr.Rourk.

## 2019-05-11 ENCOUNTER — Other Ambulatory Visit: Payer: Self-pay

## 2019-05-11 ENCOUNTER — Telehealth: Payer: Self-pay | Admitting: Gastroenterology

## 2019-05-11 DIAGNOSIS — K629 Disease of anus and rectum, unspecified: Secondary | ICD-10-CM

## 2019-05-11 DIAGNOSIS — R9389 Abnormal findings on diagnostic imaging of other specified body structures: Secondary | ICD-10-CM

## 2019-05-11 NOTE — Telephone Encounter (Signed)
LMOVM

## 2019-05-11 NOTE — Telephone Encounter (Signed)
Pt wants to change his procedure date.  [id number]

## 2019-05-11 NOTE — Telephone Encounter (Signed)
PA for flex sig submitted via [facility] website. Notification/prior Programmer, systems number [id number].

## 2019-05-11 NOTE — Telephone Encounter (Signed)
Please let pt know Dr. Gala Romney recommends flex sig with conscious sedation for abnormal rectum on CT.  Am of flex sig: hold jardiance, metformin

## 2019-05-11 NOTE — Telephone Encounter (Signed)
Pt called office, flex sig w/RMR scheduled for 08/19/19 at 8:30am (pt requested a Friday in January because he has other appts in December). COVID test 08/17/19 at 3:00pm. Pt aware to quarantine at home after the test until procedure. Orders entered. Appt letter and procedure instructions mailed to pt.

## 2019-05-11 NOTE — Telephone Encounter (Signed)
Pt called office requesting to reschedule Flex Sig to December. Flex sig rescheduled to 07/19/19 at 8:30am. COVID test 07/15/19 at 2:55pm. New appt letter and procedure instructions mailed.

## 2019-05-13 ENCOUNTER — Other Ambulatory Visit: Payer: Self-pay

## 2019-05-13 NOTE — Telephone Encounter (Signed)
Flex Sig approved. PA# [id number], valid 07/19/19-10/17/19.

## 2019-05-26 ENCOUNTER — Ambulatory Visit: Payer: 59 | Admitting: Nurse Practitioner

## 2019-06-10 ENCOUNTER — Ambulatory Visit (INDEPENDENT_AMBULATORY_CARE_PROVIDER_SITE_OTHER): Payer: 59 | Admitting: Urology

## 2019-06-10 DIAGNOSIS — N401 Enlarged prostate with lower urinary tract symptoms: Secondary | ICD-10-CM

## 2019-06-10 DIAGNOSIS — R3912 Poor urinary stream: Secondary | ICD-10-CM

## 2019-06-10 DIAGNOSIS — R3914 Feeling of incomplete bladder emptying: Secondary | ICD-10-CM

## 2019-07-15 ENCOUNTER — Other Ambulatory Visit (HOSPITAL_COMMUNITY)
Admission: RE | Admit: 2019-07-15 | Discharge: 2019-07-15 | Disposition: A | Discharge status: Home / Self Care | Payer: 59 | Source: Ambulatory Visit | Attending: Internal Medicine | Admitting: Internal Medicine

## 2019-07-15 ENCOUNTER — Other Ambulatory Visit: Payer: Self-pay

## 2019-07-15 DIAGNOSIS — Z01812 Encounter for preprocedural laboratory examination: Secondary | ICD-10-CM | POA: Diagnosis not present

## 2019-07-15 DIAGNOSIS — Z20828 Contact with and (suspected) exposure to other viral communicable diseases: Secondary | ICD-10-CM | POA: Insufficient documentation

## 2019-07-15 LAB — SARS CORONAVIRUS 2 (TAT 6-24 HRS): SARS Coronavirus 2: NEGATIVE

## 2019-07-19 ENCOUNTER — Encounter (HOSPITAL_COMMUNITY): Payer: Self-pay | Admitting: Internal Medicine

## 2019-07-19 ENCOUNTER — Encounter (HOSPITAL_COMMUNITY)
Admission: RE | Disposition: A | Discharge status: Home / Self Care | Payer: Self-pay | Source: Home / Self Care | Attending: Internal Medicine

## 2019-07-19 ENCOUNTER — Other Ambulatory Visit: Payer: Self-pay

## 2019-07-19 ENCOUNTER — Ambulatory Visit (HOSPITAL_COMMUNITY)
Admission: RE | Admit: 2019-07-19 | Discharge: 2019-07-19 | Disposition: A | Discharge status: Home / Self Care | Payer: 59 | Attending: Internal Medicine | Admitting: Internal Medicine

## 2019-07-19 DIAGNOSIS — Z79899 Other long term (current) drug therapy: Secondary | ICD-10-CM | POA: Insufficient documentation

## 2019-07-19 DIAGNOSIS — Z86010 Personal history of colonic polyps: Secondary | ICD-10-CM | POA: Insufficient documentation

## 2019-07-19 DIAGNOSIS — K640 First degree hemorrhoids: Secondary | ICD-10-CM | POA: Insufficient documentation

## 2019-07-19 DIAGNOSIS — E119 Type 2 diabetes mellitus without complications: Secondary | ICD-10-CM | POA: Diagnosis not present

## 2019-07-19 DIAGNOSIS — Z7984 Long term (current) use of oral hypoglycemic drugs: Secondary | ICD-10-CM | POA: Insufficient documentation

## 2019-07-19 DIAGNOSIS — K629 Disease of anus and rectum, unspecified: Secondary | ICD-10-CM

## 2019-07-19 DIAGNOSIS — R933 Abnormal findings on diagnostic imaging of other parts of digestive tract: Secondary | ICD-10-CM | POA: Diagnosis not present

## 2019-07-19 DIAGNOSIS — I10 Essential (primary) hypertension: Secondary | ICD-10-CM | POA: Insufficient documentation

## 2019-07-19 DIAGNOSIS — E785 Hyperlipidemia, unspecified: Secondary | ICD-10-CM | POA: Insufficient documentation

## 2019-07-19 DIAGNOSIS — R9389 Abnormal findings on diagnostic imaging of other specified body structures: Secondary | ICD-10-CM

## 2019-07-19 HISTORY — PX: FLEXIBLE SIGMOIDOSCOPY: SHX5431

## 2019-07-19 LAB — GLUCOSE, CAPILLARY: Glucose-Capillary: 210 mg/dL — ABNORMAL HIGH (ref 70–99)

## 2019-07-19 SURGERY — SIGMOIDOSCOPY, FLEXIBLE
Anesthesia: Moderate Sedation

## 2019-07-19 MED ORDER — MEPERIDINE HCL 50 MG/ML IJ SOLN
INTRAMUSCULAR | Status: AC
  Filled 2019-07-19: qty 1

## 2019-07-19 MED ORDER — ONDANSETRON HCL 4 MG/2ML IJ SOLN
INTRAMUSCULAR | Status: AC
  Filled 2019-07-19: qty 2

## 2019-07-19 MED ORDER — MIDAZOLAM HCL 5 MG/5ML IJ SOLN
INTRAMUSCULAR | Status: DC | PRN
  Administered 2019-07-19: 2 mg via INTRAVENOUS

## 2019-07-19 MED ORDER — STERILE WATER FOR IRRIGATION IR SOLN
Status: DC | PRN
  Administered 2019-07-19: 09:00:00 1.5 mL

## 2019-07-19 MED ORDER — MEPERIDINE HCL 100 MG/ML IJ SOLN
INTRAMUSCULAR | Status: DC | PRN
  Administered 2019-07-19: 25 mg via INTRAVENOUS

## 2019-07-19 MED ORDER — SODIUM CHLORIDE 0.9 % IV SOLN: INTRAVENOUS | Status: DC

## 2019-07-19 MED ORDER — MIDAZOLAM HCL 5 MG/5ML IJ SOLN
INTRAMUSCULAR | Status: AC
  Filled 2019-07-19: qty 10

## 2019-07-19 MED ORDER — ONDANSETRON HCL 4 MG/2ML IJ SOLN
INTRAMUSCULAR | Status: DC | PRN
  Administered 2019-07-19: 4 mg via INTRAVENOUS

## 2019-07-19 NOTE — H&P (Signed)
@LOGO @   Primary Care Physician:  [name], MD Primary Gastroenterologist:  Dr. Gala Romney  Pre-Procedure History & Physical: HPI:  Bradley Hurst is a 58 y.o. male here for further evaluation of possible abnormal rectum on CT done recently.  Past Medical History:  Diagnosis Date  . Bladder outlet obstruction   . BPH (benign prostatic hyperplasia)   . Hyperlipidemia   . Hypertension   . Type 2 diabetes mellitus ([place])     Past Surgical History:  Procedure Laterality Date  . CIRCUMCISION  2017  . COLONOSCOPY N/A 04/23/2018   Dr. Gala Romney: 57mm hyperplasitc sigmoid polyp removed. next colonoscopy in 10 years  . POLYPECTOMY  04/23/2018   Procedure: POLYPECTOMY;  Surgeon: Daneil Dolin, MD;  [facility];  Service: Endoscopy;;    Prior to Admission medications   Medication Sig Start Date End Date Taking? Authorizing Provider  acarbose (PRECOSE) 100 MG tablet Take 100 mg by mouth 3 (three) times daily.  04/11/19  Yes [provider]  amLODipine (NORVASC) 10 MG tablet Take 10 mg by mouth at bedtime.    Yes [provider]  empagliflozin (JARDIANCE) 25 MG TABS tablet Take 25 mg by mouth daily.   Yes [provider]  ezetimibe (ZETIA) 10 MG tablet Take 10 mg by mouth at bedtime.  02/06/18  Yes [provider]  losartan-hydrochlorothiazide (HYZAAR) 100-25 MG tablet Take 1 tablet by mouth daily.   Yes [provider]  metFORMIN (GLUCOPHAGE) 1000 MG tablet Take 1,000 mg by mouth 2 (two) times daily with a meal.   Yes [provider]  metoprolol tartrate (LOPRESSOR) 25 MG tablet Take 25 mg by mouth at bedtime.   Yes [provider]  Omega-3 Fatty Acids (FISH OIL) 1000 MG CAPS Take 1,000 mg by mouth 3 (three) times daily.   Yes [provider]  Potassium Chloride CR (MICRO-K) 8 MEQ CPCR capsule CR Take 8 mEq by mouth at bedtime.   Yes [provider]  tadalafil (CIALIS) 5 MG tablet Take 5 mg by mouth every 3  (three) days.    Yes [provider]  topiramate (TOPAMAX) 50 MG tablet Take 50 mg by mouth 2 (two) times daily.  02/08/18  Yes [provider]  TRULICITY [id number] 0000000 SOPN Inject 0.75 mg into the skin every Saturday.  02/03/18  Yes [provider]  calcium carbonate (TUMS - DOSED IN MG ELEMENTAL CALCIUM) 500 MG chewable tablet Chew 1 tablet by mouth daily as needed for indigestion or heartburn.    [provider]    Allergies as of 05/11/2019  . (No Known Allergies)    Family History  Problem Relation Age of Onset  . Diabetes Mother   . Heart attack Father   . Ovarian cancer Sister     Social History   Socioeconomic History  . Marital status: Married    Spouse name: Not on file  . Number of children: Not on file  . Years of education: Not on file  . Highest education level: Not on file  Occupational History  . Not on file  Tobacco Use  . Smoking status: Never Smoker  . Smokeless tobacco: Never Used  Substance and Sexual Activity  . Alcohol use: No  . Drug use: No  . Sexual activity: Not on file  Other Topics Concern  . Not on file  Social History Narrative  . Not on file   Social Determinants of Health   Financial Resource Strain:   .  Difficulty of Paying Living Expenses: Not on file  Food Insecurity:   . Worried About Charity fundraiser in the Last Year: Not on file  . Ran Out of Food in the Last Year: Not on file  Transportation Needs:   . Lack of Transportation (Medical): Not on file  . Lack of Transportation (Non-Medical): Not on file  Physical Activity:   . Days of Exercise per Week: Not on file  . Minutes of Exercise per Session: Not on file  Stress:   . Feeling of Stress : Not on file  Social Connections:   . Frequency of Communication with Friends and Family: Not on file  . Frequency of Social Gatherings with Friends and Family: Not on file  . Attends Religious Services: Not on file  . Active Member of Clubs or  Organizations: Not on file  . Attends Archivist Meetings: Not on file  . Marital Status: Not on file  Intimate Partner Violence:   . Fear of Current or Ex-Partner: Not on file  . Emotionally Abused: Not on file  . Physically Abused: Not on file  . Sexually Abused: Not on file    Review of Systems: See HPI, otherwise negative ROS  Physical Exam: BP 121/78   Pulse 74   Temp 97.7 F (36.5 C) (Oral)   Resp 14   Ht 5\' 9"  (1.753 m)   SpO2 97%   BMI 39.58 kg/m  General:   Alert,  Well-developed, well-nourished, pleasant and cooperative in NAD Neck:  Supple; no masses or thyromegaly. No significant cervical adenopathy. Lungs:  Clear throughout to auscultation.   No wheezes, crackles, or rhonchi. No acute distress. Heart:  Regular rate and rhythm; no murmurs, clicks, rubs,  or gallops. Abdomen: Non-distended, normal bowel sounds.  Soft and nontender without appreciable mass or hepatosplenomegaly.  Pulses:  Normal pulses noted. Extremities:  Without clubbing or edema.  Impression/Plan: 57 year old gentleman with abnormal rectum seen on CT. Flexible sigmoidoscopy now be done to further evaluate. The risks, benefits, limitations, alternatives and imponderables have been reviewed with the patient. Questions have been answered. All parties are agreeable.

## 2019-07-19 NOTE — Discharge Instructions (Signed)
  Sigmoidoscopy Discharge Instructions  Read the instructions outlined below and refer to this sheet in the next few weeks. These discharge instructions provide you with general information on caring for yourself after you leave the hospital. Your doctor may also give you specific instructions. While your treatment has been planned according to the most current medical practices available, unavoidable complications occasionally occur. If you have any problems or questions after discharge, call Dr. Gala Romney at [phone]. ACTIVITY  You may resume your regular activity, but move at a slower pace for the next 24 hours.   Take frequent rest periods for the next 24 hours.   Walking will help get rid of the air and reduce the bloated feeling in your belly (abdomen).   No driving for 24 hours (because of the medicine (anesthesia) used during the test).    Do not sign any important legal documents or operate any machinery for 24 hours (because of the anesthesia used during the test).  NUTRITION  Drink plenty of fluids.   You may resume your normal diet as instructed by your doctor.   Begin with a light meal and progress to your normal diet. Heavy or fried foods are harder to digest and may make you feel sick to your stomach (nauseated).   Avoid alcoholic beverages for 24 hours or as instructed.  MEDICATIONS  You may resume your normal medications unless your doctor tells you otherwise.  WHAT YOU CAN EXPECT TODAY  Some feelings of bloating in the abdomen.   Passage of more gas than usual.   Spotting of blood in your stool or on the toilet paper.  IF YOU HAD POLYPS REMOVED DURING THE COLONOSCOPY:  No aspirin products for 7 days or as instructed.   No alcohol for 7 days or as instructed.   Eat a soft diet for the next 24 hours.  FINDING OUT THE RESULTS OF YOUR TEST Not all test results are available during your visit. If your test results are not back during the visit, make an appointment  with your caregiver to find out the results. Do not assume everything is normal if you have not heard from your caregiver or the medical facility. It is important for you to follow up on all of your test results.  SEEK IMMEDIATE MEDICAL ATTENTION IF:  You have more than a spotting of blood in your stool.   Your belly is swollen (abdominal distention).   You are nauseated or vomiting.   You have a temperature over 101.   You have abdominal pain or discomfort that is severe or gets worse throughout the day.    Your rectum looks fine today.  You did have some small hemorrhoids.  Just to clarify, recent x-rays did demonstrate you have a gallbladder stone and also a kidney stone on the right side.  Please check with Dr. Blanch Media and make sure the work-up for the blood in your urine has been completed by Dr. Jeffie Pollock  As long as you are not having any GI/bowel issues, no further evaluation is recommended from a GI standpoint.  At patient request, called the Azeez Seck, wife, [phone] and reviewed results.

## 2019-07-19 NOTE — Op Note (Signed)
[facility] Patient Name: Bradley Hurst Procedure Date: 07/19/2019 8:32 AM MRN: [id number] Date of Birth: [birth year] Attending MD: Norvel Richards , MD CSN: [id number] Age: 58 Admit Type: Outpatient Procedure:                Flexible Sigmoidoscopy Indications:              Abnormal CT of the GI tract Providers:                Norvel Richards, MD, Otis Peak B. Gwenlyn Perking RN, RN,                            Nelma Rothman, Technician Referring MD:              Medicines:                Midazolam 2 mg IV, Meperidine 25 mg IV, Ondansetron                            4 mg IV Complications:            No immediate complications. Estimated Blood Loss:     Estimated blood loss: none. Procedure:                Pre-Anesthesia Assessment:                           - Prior to the procedure, a History and Physical                            was performed, and patient medications and                            allergies were reviewed. The patient's tolerance of                            previous anesthesia was also reviewed. The risks                            and benefits of the procedure and the sedation                            options and risks were discussed with the patient.                            All questions were answered, and informed consent                            was obtained. Prior Anticoagulants: The patient has                            taken no previous anticoagulant or antiplatelet                            agents. ASA Grade Assessment: II - A patient with  mild systemic disease. After reviewing the risks                            and benefits, the patient was deemed in                            satisfactory condition to undergo the procedure.                           After obtaining informed consent, the scope was                            passed under direct vision. The [id number]                            scope was introduced  through the anus and advanced                            to the the sigmoid colon. The flexible                            sigmoidoscopy was accomplished without difficulty.                            The patient tolerated the procedure well. The                            quality of the bowel preparation was adequate. Scope In: 8:43:34 AM Scope Out: 8:45:57 AM Total Procedure Duration: 0 hours 2 minutes 23 seconds  Findings:      Non-bleeding internal hemorrhoids were found during retroflexion. The       hemorrhoids were mild, small and Grade I (internal hemorrhoids that do       not prolapse). Examination and carried out to the mid sigmoid.       Examination of the distal sigmoid and rectum revealed no mucosal       abnormalities aside from hemorrhoids. Impression:               -Grade 1 hemorrhoids. Otherwise, rectum and distal                            sigmoid appendix appeared normal. Artifact most                            likely on CT. Moderate Sedation:      Moderate (conscious) sedation was administered by the endoscopy nurse       and supervised by the endoscopist. The following parameters were       monitored: oxygen saturation, heart rate, blood pressure, respiratory       rate, EKG, adequacy of pulmonary ventilation, and response to care.       Total physician intraservice time was 5 minutes. Recommendation:           - Patient has a contact number available for  emergencies. The signs and symptoms of potential                            delayed complications were discussed with the                            patient. Return to normal activities tomorrow.                            Written discharge instructions were provided to the                            patient. Follow-up with Dr. Zada Girt and Jeffie Pollock.                            Please get clarification that your work-up for                            hematuria has been wrapped up as you  reported Procedure Code(s):        --- Professional ---                           [phone], Sigmoidoscopy, flexible; diagnostic,                            including collection of specimen(s) by brushing or                            washing, when performed (separate procedure) Diagnosis Code(s):        --- Professional ---                           R93.3, Abnormal findings on diagnostic imaging of                            other parts of digestive tract CPT copyright 2019 American [employer] Association. All rights reserved. The codes documented in this report are preliminary and upon coder review may  be revised to meet current compliance requirements. Cristopher Estimable. Lexine Jaspers, MD Norvel Richards, MD 07/19/2019 8:58:02 AM This report has been signed electronically. Number of Addenda: 0

## 2019-08-17 ENCOUNTER — Other Ambulatory Visit (HOSPITAL_COMMUNITY): Payer: 59

## 2019-12-01 ENCOUNTER — Encounter: Payer: Self-pay | Admitting: Urology

## 2019-12-02 ENCOUNTER — Ambulatory Visit: Payer: 59 | Admitting: Urology

## 2019-12-02 ENCOUNTER — Other Ambulatory Visit: Payer: Self-pay

## 2019-12-02 ENCOUNTER — Encounter: Payer: Self-pay | Admitting: Urology

## 2019-12-02 VITALS — BP 130/82 | HR 81 | Temp 97.0°F | Ht 70.0 in | Wt 265.0 lb

## 2019-12-02 DIAGNOSIS — N400 Enlarged prostate without lower urinary tract symptoms: Secondary | ICD-10-CM | POA: Diagnosis not present

## 2019-12-02 DIAGNOSIS — R351 Nocturia: Secondary | ICD-10-CM

## 2019-12-02 DIAGNOSIS — Z87440 Personal history of urinary (tract) infections: Secondary | ICD-10-CM

## 2019-12-02 DIAGNOSIS — R339 Retention of urine, unspecified: Secondary | ICD-10-CM

## 2019-12-02 DIAGNOSIS — R3915 Urgency of urination: Secondary | ICD-10-CM

## 2019-12-02 LAB — POCT URINALYSIS DIPSTICK
Bilirubin, UA: NEGATIVE
Blood, UA: NEGATIVE
Glucose, UA: POSITIVE — AB
Ketones, UA: NEGATIVE
Leukocytes, UA: NEGATIVE
Nitrite, UA: NEGATIVE
Protein, UA: NEGATIVE
Spec Grav, UA: 1.025 (ref 1.010–1.025)
Urobilinogen, UA: 0.2 E.U./dL
pH, UA: 6 (ref 5.0–8.0)

## 2019-12-02 LAB — BLADDER SCAN AMB NON-IMAGING: Scan Result: 382

## 2019-12-02 MED ORDER — TAMSULOSIN HCL 0.4 MG PO CAPS: 0.4000 mg | ORAL_CAPSULE | Freq: Every day | ORAL | 3 refills | Status: DC

## 2019-12-02 NOTE — Progress Notes (Signed)
Subjective:  1. Benign prostatic hyperplasia, unspecified whether lower urinary tract symptoms present   2. Incomplete bladder emptying   3. Nocturia   4. Urgency of urination   5. Personal history of urinary infection      Rykker returns today in f/u.  He was last seen in 11/20.  He has a history of BPH with BOO and was interested in a Urolift procedure. He is currently on Tadalafil daily from Dr. Sherrie Sport and is doing better. At his last visit  PF was 36ml/sec with a PVR of 486ml and an IPSS of 34.Marland Kitchen Cystoscopy showed a 3cm prostate with bilobar hyperplasia. He was having some increased symptoms with suprapubic pain recently and he was found to have an e.coli UTI and was given Cipro about 2 weeks ago and his symptoms have improved.  His IPSS is down to 12 .   His PVR remains elevated at 359ml.   He is no interested in medical therapy for the LUTS.    IPSS    Row Name 12/02/19 1400         International Prostate Symptom Score   How often have you had the sensation of not emptying your bladder?  Less than half the time     How often have you had to urinate less than every two hours?  Less than 1 in 5 times     How often have you found you stopped and started again several times when you urinated?  Less than half the time     How often have you found it difficult to postpone urination?  Less than half the time     How often have you had a weak urinary stream?  About half the time     How often have you had to strain to start urination?  Not at All     How many times did you typically get up at night to urinate?  2 Times     Total IPSS Score  12       Quality of Life due to urinary symptoms   If you were to spend the rest of your life with your urinary condition just the way it is now how would you feel about that?  Mixed         ROS:  ROS:  A complete review of systems was performed.  All systems are negative except for pertinent findings as noted.   Review of Systems   Constitutional: Positive for malaise/fatigue.       He has some night sweats.   Respiratory: Positive for cough and shortness of breath.   Gastrointestinal: Positive for heartburn.  Genitourinary:       ED, reduced stream, intermittancy and urgency.    Musculoskeletal: Positive for back pain and joint pain.  Endo/Heme/Allergies: Positive for polydipsia.    No Known Allergies  Outpatient Encounter Medications as of 12/02/2019  Medication Sig  . acarbose (PRECOSE) 100 MG tablet Take 100 mg by mouth 3 (three) times daily.   Marland Kitchen amLODipine (NORVASC) 10 MG tablet Take 10 mg by mouth at bedtime.   . calcium carbonate (TUMS - DOSED IN MG ELEMENTAL CALCIUM) 500 MG chewable tablet Chew 1 tablet by mouth daily as needed for indigestion or heartburn.  . empagliflozin (JARDIANCE) 25 MG TABS tablet Take 25 mg by mouth daily.  Marland Kitchen ezetimibe (ZETIA) 10 MG tablet Take 10 mg by mouth at bedtime.   . hydrochlorothiazide (MICROZIDE) 12.5 MG capsule Take 12.5 mg by  mouth daily.  Marland Kitchen losartan (COZAAR) 100 MG tablet Take 100 mg by mouth daily.  Marland Kitchen losartan-hydrochlorothiazide (HYZAAR) 100-25 MG tablet Take 1 tablet by mouth daily.  . metFORMIN (GLUCOPHAGE) 1000 MG tablet Take 1,000 mg by mouth 2 (two) times daily with a meal.  . metoprolol tartrate (LOPRESSOR) 25 MG tablet Take 25 mg by mouth at bedtime.  . Omega-3 Fatty Acids (FISH OIL) 1000 MG CAPS Take 1,000 mg by mouth 3 (three) times daily.  . Potassium Chloride CR (MICRO-K) 8 MEQ CPCR capsule CR Take 8 mEq by mouth at bedtime.  . pravastatin (PRAVACHOL) 40 MG tablet Take 40 mg by mouth daily.  . tadalafil (CIALIS) 5 MG tablet Take 5 mg by mouth every 3 (three) days.   Marland Kitchen topiramate (TOPAMAX) 50 MG tablet Take 50 mg by mouth 2 (two) times daily.   . TRULICITY A999333 0000000 SOPN Inject 0.75 mg into the skin every Saturday.   . tamsulosin (FLOMAX) 0.4 MG CAPS capsule Take 1 capsule (0.4 mg total) by mouth daily.   No facility-administered encounter medications  on file as of 12/02/2019.    Past Medical History:  Diagnosis Date  . Bladder outlet obstruction   . BPH (benign prostatic hyperplasia)   . Elevated PSA   . Feeling of incomplete bladder emptying   . Hyperlipidemia   . Hypertension   . Type 2 diabetes mellitus ([place])   . Weak urinary stream     Past Surgical History:  Procedure Laterality Date  . CIRCUMCISION  2017  . COLONOSCOPY N/A 04/23/2018   Dr. Gala Romney: 95mm hyperplasitc sigmoid polyp removed. next colonoscopy in 10 years  . FLEXIBLE SIGMOIDOSCOPY N/A 07/19/2019   Procedure: FLEXIBLE SIGMOIDOSCOPY;  Surgeon: Daneil Dolin, MD;  [facility]: [facility];  Service: Endoscopy;  Laterality: N/A;  8:30am  . POLYPECTOMY  04/23/2018   Procedure: POLYPECTOMY;  Surgeon: Daneil Dolin, MD;  Location: [facility];  Service: Endoscopy;;    Social History   Socioeconomic History  . Marital status: Married    Spouse name: Not on file  . Number of children: Not on file  . Years of education: Not on file  . Highest education level: Not on file  Occupational History  . Not on file  Tobacco Use  . Smoking status: Never Smoker  . Smokeless tobacco: Never Used  Substance and Sexual Activity  . Alcohol use: No  . Drug use: No  . Sexual activity: Not on file  Other Topics Concern  . Not on file  Social History Narrative  . Not on file   Social Determinants of Health   Financial Resource Strain:   . Difficulty of Paying Living Expenses:   Food Insecurity:   . Worried About Charity fundraiser in the Last Year:   . Arboriculturist in the Last Year:   Transportation Needs:   . Film/video editor (Medical):   Marland Kitchen Lack of Transportation (Non-Medical):   Physical Activity:   . Days of Exercise per Week:   . Minutes of Exercise per Session:   Stress:   . Feeling of Stress :   Social Connections:   . Frequency of Communication with Friends and Family:   . Frequency of Social Gatherings with Friends and Family:   . Attends  Religious Services:   . Active Member of Clubs or Organizations:   . Attends Archivist Meetings:   Marland Kitchen Marital Status:   Intimate Partner Violence:   .  Fear of Current or Ex-Partner:   . Emotionally Abused:   Marland Kitchen Physically Abused:   . Sexually Abused:     Family History  Problem Relation Age of Onset  . Diabetes Mother   . Heart attack Father   . Ovarian cancer Sister        Objective: Vitals:   12/02/19 1406  BP: 130/82  Pulse: 81  Temp: (!) 97 F (36.1 C)     Physical Exam  Lab Results:  Results for orders placed or performed in visit on 12/02/19 (from the past 24 hour(s))  POCT urinalysis dipstick     Status: Abnormal   Collection Time: 12/02/19  2:12 PM  Result Value Ref Range   Color, UA yellow    Clarity, UA     Glucose, UA Positive (A) Negative   Bilirubin, UA neg    Ketones, UA neg    Spec Grav, UA 1.025 1.010 - 1.025   Blood, UA neg    pH, UA 6.0 5.0 - 8.0   Protein, UA Negative Negative   Urobilinogen, UA 0.2 0.2 or 1.0 E.U./dL   Nitrite, UA neg    Leukocytes, UA Negative Negative   Appearance clear    Odor      BMET No results for input(s): NA, K, CL, CO2, GLUCOSE, BUN, CREATININE, CALCIUM in the last 72 hours. PSA No results found for: PSA No results found for: TESTOSTERONE    Studies/Results: No results found.    Assessment & Plan: BPH with BOO and incomplete emptying.  His symptoms have improved with antibiotic therapy and his PVR while elevated is lower.  He would now like to consider medical therapy for his symptoms before considering a procedure so I will get him started on Tamsulosin.  I reviewed the risks and sent a script.    History of UTI's.  He had e. Coli on a recent culture but the UA is ok post treatment.      Meds ordered this encounter  Medications  . tamsulosin (FLOMAX) 0.4 MG CAPS capsule    Sig: Take 1 capsule (0.4 mg total) by mouth daily.    Dispense:  90 capsule    Refill:  3     Orders Placed  This Encounter  Procedures  . POCT urinalysis dipstick  . BLADDER SCAN AMB NON-IMAGING      Return in about 6 weeks (around 01/13/2020) for Flowrate and PVR on return. .   CC: [name], MD      Irine Seal 12/02/2019

## 2020-01-20 ENCOUNTER — Ambulatory Visit: Payer: 59 | Admitting: Urology

## 2020-01-31 NOTE — Progress Notes (Signed)
Subjective:  1. BPH with urinary obstruction   2. Incomplete bladder emptying   3. Weak urinary stream   4. Urgency of urination   5. Personal history of urinary infection   6. Candidal balanitis      Bradley Hurst returns today in f/u.   He has a history of BPH with BOO and was interested in a Urolift procedure but decided to try tamsulosin initially but had urgency with UUI.  He also got a UTI and was treated for that and completed the antibiotic about a week ago but he is still burning.  He has no hematuria.  He stopped the tamsulosin and the leakage has resolved.   His UA has cleared today.   He had a CT in 8/20 for an unrelated issue and his prostate volume is 74ml with no middle lobe.    His PVR today is 534ml and he did a flowrate with an intermittent curve and a PF of 1ml/sec.   He is remains on Tadalafil daily.  His IPSS is 15 which is improved.  Prior  cystoscopy showed a 3cm prostate with bilobar hyperplasia. He was seen in 4/21 following treatment of an e. Coli UTI as well.     He is also reporting some penile irritation.     IPSS    Row Name 02/03/20 1000         International Prostate Symptom Score   How often have you had the sensation of not emptying your bladder? Not at All     How often have you had to urinate less than every two hours? Less than half the time     How often have you found you stopped and started again several times when you urinated? More than half the time     How often have you found it difficult to postpone urination? About half the time     How often have you had a weak urinary stream? Less than half the time     How often have you had to strain to start urination? Less than 1 in 5 times     How many times did you typically get up at night to urinate? 3 Times     Total IPSS Score 15       Quality of Life due to urinary symptoms   If you were to spend the rest of your life with your urinary condition just the way it is now how would you feel about  that? Mostly Disatisfied             ROS:  ROS:  A complete review of systems was performed.  All systems are negative except for pertinent findings as noted.   ROS  No Known Allergies  Outpatient Encounter Medications as of 02/03/2020  Medication Sig   acarbose (PRECOSE) 100 MG tablet Take 100 mg by mouth 3 (three) times daily.    amLODipine (NORVASC) 10 MG tablet Take 10 mg by mouth at bedtime.    calcium carbonate (TUMS - DOSED IN MG ELEMENTAL CALCIUM) 500 MG chewable tablet Chew 1 tablet by mouth daily as needed for indigestion or heartburn.   empagliflozin (JARDIANCE) 25 MG TABS tablet Take 25 mg by mouth daily.   ezetimibe (ZETIA) 10 MG tablet Take 10 mg by mouth at bedtime.    finasteride (PROSCAR) 5 MG tablet Take 1 tablet (5 mg total) by mouth daily.   hydrochlorothiazide (MICROZIDE) 12.5 MG capsule Take 12.5 mg by mouth daily.  losartan (COZAAR) 100 MG tablet Take 100 mg by mouth daily.   metFORMIN (GLUCOPHAGE) 1000 MG tablet Take 1,000 mg by mouth 2 (two) times daily with a meal.   metoprolol tartrate (LOPRESSOR) 25 MG tablet Take 25 mg by mouth at bedtime.   nystatin cream (MYCOSTATIN) Apply 1 application topically 2 (two) times daily. Apply to affected area sparingly 2 x daily   Omega-3 Fatty Acids (FISH OIL) 1000 MG CAPS Take 1,000 mg by mouth 3 (three) times daily.   Potassium Chloride CR (MICRO-K) 8 MEQ CPCR capsule CR Take 8 mEq by mouth at bedtime.   pravastatin (PRAVACHOL) 40 MG tablet Take 40 mg by mouth daily.   tadalafil (CIALIS) 5 MG tablet Take 5 mg by mouth every 3 (three) days.    tamsulosin (FLOMAX) 0.4 MG CAPS capsule Take 1 capsule (0.4 mg total) by mouth daily.   topiramate (TOPAMAX) 50 MG tablet Take 50 mg by mouth 2 (two) times daily.    TRULICITY 2.83 MO/2.9UT SOPN Inject 0.75 mg into the skin every Saturday.    [DISCONTINUED] losartan-hydrochlorothiazide (HYZAAR) 100-25 MG tablet Take 1 tablet by mouth daily.   No  facility-administered encounter medications on file as of 02/03/2020.    Past Medical History:  Diagnosis Date   Bladder outlet obstruction    BPH (benign prostatic hyperplasia)    Elevated PSA    Feeling of incomplete bladder emptying    Hyperlipidemia    Hypertension    Type 2 diabetes mellitus (HCC)    Weak urinary stream     Past Surgical History:  Procedure Laterality Date   CIRCUMCISION  2017   COLONOSCOPY N/A 04/23/2018   Dr. Gala Romney: 71mm hyperplasitc sigmoid polyp removed. next colonoscopy in 10 years   FLEXIBLE SIGMOIDOSCOPY N/A 07/19/2019   Procedure: FLEXIBLE SIGMOIDOSCOPY;  Surgeon: Daneil Dolin, MD;  [facility]: [facility];  Service: Endoscopy;  Laterality: N/A;  8:30am   POLYPECTOMY  04/23/2018   Procedure: POLYPECTOMY;  Surgeon: Daneil Dolin, MD;  Location: [facility];  Service: Endoscopy;;    Social History   Socioeconomic History   Marital status: Married    Spouse name: Not on file   Number of children: Not on file   Years of education: Not on file   Highest education level: Not on file  Occupational History   Not on file  Tobacco Use   Smoking status: Never Smoker   Smokeless tobacco: Never Used  Substance and Sexual Activity   Alcohol use: No   Drug use: No   Sexual activity: Not on file  Other Topics Concern   Not on file  Social History Narrative   Not on file   Social Determinants of Health   Financial Resource Strain:    Difficulty of Paying Living Expenses:   Food Insecurity:    Worried About Running Out of Food in the Last Year:    Arboriculturist in the Last Year:   Transportation Needs:    Film/video editor (Medical):    Lack of Transportation (Non-Medical):   Physical Activity:    Days of Exercise per Week:    Minutes of Exercise per Session:   Stress:    Feeling of Stress :   Social Connections:    Frequency of Communication with Friends and Family:    Frequency of Social  Gatherings with Friends and Family:    Attends Religious Services:    Active Member of Clubs or Organizations:  Attends Archivist Meetings:    Marital Status:   Intimate Partner Violence:    Fear of Current or Ex-Partner:    Emotionally Abused:    Physically Abused:    Sexually Abused:     Family History  Problem Relation Age of Onset   Diabetes Mother    Heart attack Father    Ovarian cancer Sister        Objective: Vitals:   02/03/20 0951  BP: 126/87  Pulse: 71  Temp: (!) 96.8 F (36 C)     Physical Exam Genitourinary:    Comments: Uncircumcised with candidal balanoposthitis.     Lab Results:  Results for orders placed or performed in visit on 02/03/20 (from the past 24 hour(s))  POCT urinalysis dipstick     Status: Abnormal   Collection Time: 02/03/20  9:57 AM  Result Value Ref Range   Color, UA yellow    Clarity, UA clear    Glucose, UA Positive (A) Negative   Bilirubin, UA neg    Ketones, UA neg    Spec Grav, UA 1.020 1.010 - 1.025   Blood, UA neg    pH, UA 5.0 5.0 - 8.0   Protein, UA Negative Negative   Urobilinogen, UA negative (A) 0.2 or 1.0 E.U./dL   Nitrite, UA neg    Leukocytes, UA Negative Negative   Appearance     Odor      BMET No results for input(s): NA, K, CL, CO2, GLUCOSE, BUN, CREATININE, CALCIUM in the last 72 hours. PSA No results found for: PSA No results found for: TESTOSTERONE   Uroflow  Peak Flow: 11ml Average Flow: 2ml Voided Volume: 270ml Voiding Time: 70sec Flow Time: 53sec Time to Peak Flow: 2sec  PVR Volume: 558 ml    Studies/Results: No results found.    Assessment & Plan: BPH with BOO and incomplete emptying.  He got worse with tamsulosin but it is possible he had another UTI that confounded the outcome.  I discussed finasteride, alternate alpha blockers, Urolift, Rezum and TURP.  He will be started on finasteride and return in 3 months for voiding studies and a PSA.  Side  effects reviewed.  History of UTI's.  His UA is clear today.  Balanitis.  I will send nystatin.     Meds ordered this encounter  Medications   finasteride (PROSCAR) 5 MG tablet    Sig: Take 1 tablet (5 mg total) by mouth daily.    Dispense:  90 tablet    Refill:  3   nystatin cream (MYCOSTATIN)    Sig: Apply 1 application topically 2 (two) times daily. Apply to affected area sparingly 2 x daily    Dispense:  30 g    Refill:  1     Orders Placed This Encounter  Procedures   PSA, total and free    Standing Status:   Future    Standing Expiration Date:   06/05/2020   Bladder scan   POCT urinalysis dipstick   PR COMPLEX UROFLOWMETRY   BLADDER SCAN AMB NON-IMAGING      Return in about 3 months (around 05/05/2020) for flowrate and PVR.   CC: [name], MD      Irine Seal 02/03/2020

## 2020-02-03 ENCOUNTER — Encounter: Payer: Self-pay | Admitting: Urology

## 2020-02-03 ENCOUNTER — Other Ambulatory Visit: Payer: Self-pay

## 2020-02-03 ENCOUNTER — Ambulatory Visit: Payer: 59 | Admitting: Urology

## 2020-02-03 VITALS — BP 126/87 | HR 71 | Temp 96.8°F

## 2020-02-03 DIAGNOSIS — N138 Other obstructive and reflux uropathy: Secondary | ICD-10-CM | POA: Diagnosis not present

## 2020-02-03 DIAGNOSIS — R3912 Poor urinary stream: Secondary | ICD-10-CM

## 2020-02-03 DIAGNOSIS — Z87440 Personal history of urinary (tract) infections: Secondary | ICD-10-CM

## 2020-02-03 DIAGNOSIS — R339 Retention of urine, unspecified: Secondary | ICD-10-CM

## 2020-02-03 DIAGNOSIS — R3915 Urgency of urination: Secondary | ICD-10-CM | POA: Diagnosis not present

## 2020-02-03 DIAGNOSIS — B3742 Candidal balanitis: Secondary | ICD-10-CM

## 2020-02-03 DIAGNOSIS — N401 Enlarged prostate with lower urinary tract symptoms: Secondary | ICD-10-CM | POA: Diagnosis not present

## 2020-02-03 LAB — POCT URINALYSIS DIPSTICK
Bilirubin, UA: NEGATIVE
Blood, UA: NEGATIVE
Glucose, UA: POSITIVE — AB
Ketones, UA: NEGATIVE
Leukocytes, UA: NEGATIVE
Nitrite, UA: NEGATIVE
Protein, UA: NEGATIVE
Spec Grav, UA: 1.02 (ref 1.010–1.025)
Urobilinogen, UA: NEGATIVE E.U./dL — AB
pH, UA: 5 (ref 5.0–8.0)

## 2020-02-03 LAB — BLADDER SCAN AMB NON-IMAGING: Scan Result: 558

## 2020-02-03 MED ORDER — FINASTERIDE 5 MG PO TABS: 5.0000 mg | ORAL_TABLET | Freq: Every day | ORAL | 3 refills | Status: DC

## 2020-02-03 MED ORDER — NYSTATIN 100000 UNIT/GM EX CREA: 1.0000 "application " | TOPICAL_CREAM | Freq: Two times a day (BID) | CUTANEOUS | 1 refills | Status: DC

## 2020-05-10 NOTE — Progress Notes (Deleted)
Subjective:  No diagnosis found.   Bradley Hurst returns today in f/u.   He has a history of BPH with BOO and was interested in a Urolift procedure but decided to try tamsulosin initially but had urgency with UUI.  He also got a UTI and was treated for that and completed the antibiotic about a week ago but he is still burning.  He has no hematuria.  He stopped the tamsulosin and the leakage has resolved.   His UA has cleared today.   He had a CT in 8/20 for an unrelated issue and his prostate volume is 15ml with no middle lobe.    His PVR today is 511ml and he did a flowrate with an intermittent curve and a PF of 16ml/sec.   He is remains on Tadalafil daily.  His IPSS is 15 which is improved.  Prior  cystoscopy showed a 3cm prostate with bilobar hyperplasia. He was seen in 4/21 following treatment of an e. Coli UTI as well.     He is also reporting some penile irritation.       ROS:  ROS:  A complete review of systems was performed.  All systems are negative except for pertinent findings as noted.   ROS  No Known Allergies  Outpatient Encounter Medications as of 05/11/2020  Medication Sig  . acarbose (PRECOSE) 100 MG tablet Take 100 mg by mouth 3 (three) times daily.   Marland Kitchen amLODipine (NORVASC) 10 MG tablet Take 10 mg by mouth at bedtime.   . calcium carbonate (TUMS - DOSED IN MG ELEMENTAL CALCIUM) 500 MG chewable tablet Chew 1 tablet by mouth daily as needed for indigestion or heartburn.  . empagliflozin (JARDIANCE) 25 MG TABS tablet Take 25 mg by mouth daily.  Marland Kitchen ezetimibe (ZETIA) 10 MG tablet Take 10 mg by mouth at bedtime.   . finasteride (PROSCAR) 5 MG tablet Take 1 tablet (5 mg total) by mouth daily.  . hydrochlorothiazide (MICROZIDE) 12.5 MG capsule Take 12.5 mg by mouth daily.  Marland Kitchen losartan (COZAAR) 100 MG tablet Take 100 mg by mouth daily.  . metFORMIN (GLUCOPHAGE) 1000 MG tablet Take 1,000 mg by mouth 2 (two) times daily with a meal.  . metoprolol tartrate (LOPRESSOR) 25 MG tablet Take  25 mg by mouth at bedtime.  Marland Kitchen nystatin cream (MYCOSTATIN) Apply 1 application topically 2 (two) times daily. Apply to affected area sparingly 2 x daily  . Omega-3 Fatty Acids (FISH OIL) 1000 MG CAPS Take 1,000 mg by mouth 3 (three) times daily.  . Potassium Chloride CR (MICRO-K) 8 MEQ CPCR capsule CR Take 8 mEq by mouth at bedtime.  . pravastatin (PRAVACHOL) 40 MG tablet Take 40 mg by mouth daily.  . tadalafil (CIALIS) 5 MG tablet Take 5 mg by mouth every 3 (three) days.   . tamsulosin (FLOMAX) 0.4 MG CAPS capsule Take 1 capsule (0.4 mg total) by mouth daily.  Marland Kitchen topiramate (TOPAMAX) 50 MG tablet Take 50 mg by mouth 2 (two) times daily.   . TRULICITY 0.27 XA/1.2IN SOPN Inject 0.75 mg into the skin every Saturday.    No facility-administered encounter medications on file as of 05/11/2020.    Past Medical History:  Diagnosis Date  . Bladder outlet obstruction   . BPH (benign prostatic hyperplasia)   . Elevated PSA   . Feeling of incomplete bladder emptying   . Hyperlipidemia   . Hypertension   . Type 2 diabetes mellitus ([place])   . Weak urinary stream  Past Surgical History:  Procedure Laterality Date  . CIRCUMCISION  2017  . COLONOSCOPY N/A 04/23/2018   Dr. Gala Romney: 40mm hyperplasitc sigmoid polyp removed. next colonoscopy in 10 years  . FLEXIBLE SIGMOIDOSCOPY N/A 07/19/2019   Procedure: FLEXIBLE SIGMOIDOSCOPY;  Surgeon: Daneil Dolin, MD;  [facility];  Service: Endoscopy;  Laterality: N/A;  8:30am  . POLYPECTOMY  04/23/2018   Procedure: POLYPECTOMY;  Surgeon: Daneil Dolin, MD;  [facility]: [facility];  Service: Endoscopy;;    Social History   Socioeconomic History  . Marital status: Married    Spouse name: Not on file  . Number of children: Not on file  . Years of education: Not on file  . Highest education level: Not on file  Occupational History  . Not on file  Tobacco Use  . Smoking status: Never Smoker  . Smokeless tobacco: Never Used  Substance and  Sexual Activity  . Alcohol use: No  . Drug use: No  . Sexual activity: Not on file  Other Topics Concern  . Not on file  Social History Narrative  . Not on file   Social Determinants of Health   Financial Resource Strain:   . Difficulty of Paying Living Expenses: Not on file  Food Insecurity:   . Worried About Charity fundraiser in the Last Year: Not on file  . Ran Out of Food in the Last Year: Not on file  Transportation Needs:   . Lack of Transportation (Medical): Not on file  . Lack of Transportation (Non-Medical): Not on file  Physical Activity:   . Days of Exercise per Week: Not on file  . Minutes of Exercise per Session: Not on file  Stress:   . Feeling of Stress : Not on file  Social Connections:   . Frequency of Communication with Friends and Family: Not on file  . Frequency of Social Gatherings with Friends and Family: Not on file  . Attends Religious Services: Not on file  . Active Member of Clubs or Organizations: Not on file  . Attends Archivist Meetings: Not on file  . Marital Status: Not on file  Intimate Partner Violence:   . Fear of Current or Ex-Partner: Not on file  . Emotionally Abused: Not on file  . Physically Abused: Not on file  . Sexually Abused: Not on file    Family History  Problem Relation Age of Onset  . Diabetes Mother   . Heart attack Father   . Ovarian cancer Sister        Objective: There were no vitals filed for this visit.   Physical Exam Genitourinary:    Comments: Uncircumcised with candidal balanoposthitis.     Lab Results:  No results found for this or any previous visit (from the past 24 hour(s)).  BMET No results for input(s): NA, K, CL, CO2, GLUCOSE, BUN, CREATININE, CALCIUM in the last 72 hours. PSA No results found for: PSA No results found for: TESTOSTERONE   Uroflow  Peak Flow: 97ml Average Flow: 48ml Voided Volume: 234ml Voiding Time: 70sec Flow Time: 53sec Time to Peak Flow: 2sec  PVR  Volume: 558 ml    Studies/Results: No results found.    Assessment & Plan: BPH with BOO and incomplete emptying.  He got worse with tamsulosin but it is possible he had another UTI that confounded the outcome.  I discussed finasteride, alternate alpha blockers, Urolift, Rezum and TURP.  He will be started on finasteride and  return in 3 months for voiding studies and a PSA.  Side effects reviewed.  History of UTI's.  His UA is clear today.  Balanitis.  I will send nystatin.     No orders of the defined types were placed in this encounter.    No orders of the defined types were placed in this encounter.     No follow-ups on file.   CC: [name], MD      Bradley Hurst 05/10/2020

## 2020-05-11 ENCOUNTER — Ambulatory Visit: Payer: 59 | Admitting: Urology

## 2020-06-16 ENCOUNTER — Other Ambulatory Visit: Payer: Self-pay

## 2020-06-16 DIAGNOSIS — Z20822 Contact with and (suspected) exposure to covid-19: Secondary | ICD-10-CM

## 2020-06-17 LAB — NOVEL CORONAVIRUS, NAA: SARS-CoV-2, NAA: DETECTED — AB

## 2020-06-17 LAB — SARS-COV-2, NAA 2 DAY TAT

## 2020-06-18 ENCOUNTER — Telehealth: Payer: Self-pay | Admitting: Nurse Practitioner

## 2020-06-18 ENCOUNTER — Other Ambulatory Visit (HOSPITAL_COMMUNITY): Payer: Self-pay | Admitting: Family

## 2020-06-18 DIAGNOSIS — U071 COVID-19: Secondary | ICD-10-CM

## 2020-06-18 NOTE — Telephone Encounter (Signed)
Called to Discuss with patient about Covid symptoms and the use of the monoclonal antibody infusion for those with mild to moderate Covid symptoms and at a high risk of hospitalization.     Pt appears to qualify for this infusion due to co-morbid conditions and/or a member of an at-risk group in accordance with the FDA Emergency Use Authorization. (BMI >25, hypertension, diabetes)   Unable to reach pt or leave a voicemail due to full mailbox. My Chart message has been sent.   Alda Lea, NP [facility]  [phone]

## 2020-06-18 NOTE — Progress Notes (Signed)
I connected by phone with Bradley Hurst on 06/18/2020 at 6:26 PM to discuss the potential use of a new treatment for mild to moderate COVID-19 viral infection in non-hospitalized patients.  This patient is a 59 y.o. male that meets the FDA criteria for Emergency Use Authorization of COVID monoclonal antibody casirivimab/imdevimab, bamlanivimab/eteseviamb, or sotrovimab.  Has a (+) direct SARS-CoV-2 viral test result  Has mild or moderate COVID-19   Is NOT hospitalized due to COVID-19  Is within 10 days of symptom onset  Has at least one of the high risk factor(s) for progression to severe COVID-19 and/or hospitalization as defined in EUA.  Specific high risk criteria : BMI > 25, Diabetes and Cardiovascular disease or hypertension   Symptoms of cough, fever, fatigue began 06/14/20.    I have spoken and communicated the following to the patient or parent/caregiver regarding COVID monoclonal antibody treatment:  1. FDA has authorized the emergency use for the treatment of mild to moderate COVID-19 in adults and pediatric patients with positive results of direct SARS-CoV-2 viral testing who are 31 years of age and older weighing at least 40 kg, and who are at high risk for progressing to severe COVID-19 and/or hospitalization.  2. The significant known and potential risks and benefits of COVID monoclonal antibody, and the extent to which such potential risks and benefits are unknown.  3. Information on available alternative treatments and the risks and benefits of those alternatives, including clinical trials.  4. Patients treated with COVID monoclonal antibody should continue to self-isolate and use infection control measures (e.g., wear mask, isolate, social distance, avoid sharing personal items, clean and disinfect "high touch" surfaces, and frequent handwashing) according to CDC guidelines.   5. The patient or parent/caregiver has the option to accept or refuse COVID monoclonal antibody  treatment.  After reviewing this information with the patient, the patient has agreed to receive one of the available covid 19 monoclonal antibodies and will be provided an appropriate fact sheet prior to infusion. Bradley Gowda, NP 06/18/2020 6:26 PM

## 2020-06-19 ENCOUNTER — Ambulatory Visit (HOSPITAL_COMMUNITY)
Admission: RE | Admit: 2020-06-19 | Discharge: 2020-06-19 | Disposition: A | Discharge status: Home / Self Care | Payer: 59 | Source: Ambulatory Visit | Attending: Pulmonary Disease | Admitting: Pulmonary Disease

## 2020-06-19 DIAGNOSIS — E119 Type 2 diabetes mellitus without complications: Secondary | ICD-10-CM | POA: Insufficient documentation

## 2020-06-19 DIAGNOSIS — I10 Essential (primary) hypertension: Secondary | ICD-10-CM | POA: Diagnosis not present

## 2020-06-19 DIAGNOSIS — U071 COVID-19: Secondary | ICD-10-CM | POA: Diagnosis not present

## 2020-06-19 MED ORDER — SOTROVIMAB 500 MG/8ML IV SOLN
500.0000 mg | Freq: Once | INTRAVENOUS | Status: AC
  Administered 2020-06-19: 500 mg via INTRAVENOUS

## 2020-06-19 MED ORDER — METHYLPREDNISOLONE SODIUM SUCC 125 MG IJ SOLR: 125.0000 mg | Freq: Once | INTRAMUSCULAR | Status: DC | PRN

## 2020-06-19 MED ORDER — EPINEPHRINE 0.3 MG/0.3ML IJ SOAJ: 0.3000 mg | Freq: Once | INTRAMUSCULAR | Status: DC | PRN

## 2020-06-19 MED ORDER — SODIUM CHLORIDE 0.9 % IV SOLN: INTRAVENOUS | Status: DC

## 2020-06-19 MED ORDER — SODIUM CHLORIDE 0.9 % IV SOLN: INTRAVENOUS | Status: DC | PRN

## 2020-06-19 MED ORDER — DIPHENHYDRAMINE HCL 50 MG/ML IJ SOLN: 50.0000 mg | Freq: Once | INTRAMUSCULAR | Status: DC | PRN

## 2020-06-19 MED ORDER — FAMOTIDINE IN NACL 20-0.9 MG/50ML-% IV SOLN: 20.0000 mg | Freq: Once | INTRAVENOUS | Status: DC | PRN

## 2020-06-19 MED ORDER — ALBUTEROL SULFATE HFA 108 (90 BASE) MCG/ACT IN AERS: 2.0000 | INHALATION_SPRAY | Freq: Once | RESPIRATORY_TRACT | Status: DC | PRN

## 2020-06-19 NOTE — Discharge Instructions (Signed)

## 2020-06-19 NOTE — Progress Notes (Signed)
  Diagnosis: COVID-19  Physician:Dr Joya Gaskins  Procedure: sotrovimab  Complications: No immediate complications noted.  Discharge: Discharged home   [place], [place] 06/19/2020

## 2020-07-12 NOTE — Progress Notes (Signed)
Subjective:  1. BPH with urinary obstruction   2. Incomplete bladder emptying   3. Urge incontinence      Bradley Hurst returns today in f/u.   He has a history of BPH with BOO and was previously interested in a Urolift procedure but is currently on tamsulosin and finasteride.  He is doing a little better with some reduction in the frequency since the addition of finasteride earlier this year.  He remains on tadalafil 5mg  daily as well.  He has not had any benefit with the erections with the tadalafil.   He continued to have urgency with UUI.  He has nocturia x 2-3.  He has had a prior UTI but his UA is clear today.  His IPSS is 19.    He had a CT in 8/20 for an unrelated issue and his prostate volume is 41ml with no middle lobe.   His PVR today is over 562ml.   Prior  cystoscopy showed a 3cm prostate with bilobar hyperplasia.   He had COVID on 06/19/20 and was treated as an outpatient.   He had not been vaccinated.     IPSS    Row Name 07/13/20 1100         International Prostate Symptom Score   How often have you had the sensation of not emptying your bladder? Less than 1 in 5     How often have you had to urinate less than every two hours? About half the time     How often have you found you stopped and started again several times when you urinated? Not at All     How often have you found it difficult to postpone urination? Almost always     How often have you had a weak urinary stream? Almost always     How often have you had to strain to start urination? Less than half the time     How many times did you typically get up at night to urinate? 3 Times     Total IPSS Score 19           Quality of Life due to urinary symptoms   If you were to spend the rest of your life with your urinary condition just the way it is now how would you feel about that? Terrible             ROS:  ROS:  A complete review of systems was performed.  All systems are negative except for pertinent findings  as noted.   ROS  No Known Allergies  Outpatient Encounter Medications as of 07/13/2020  Medication Sig  . acarbose (PRECOSE) 100 MG tablet Take 100 mg by mouth 3 (three) times daily.   Marland Kitchen amLODipine (NORVASC) 10 MG tablet Take 10 mg by mouth at bedtime.   . calcium carbonate (TUMS - DOSED IN MG ELEMENTAL CALCIUM) 500 MG chewable tablet Chew 1 tablet by mouth daily as needed for indigestion or heartburn.  . empagliflozin (JARDIANCE) 25 MG TABS tablet Take 25 mg by mouth daily.  Marland Kitchen ezetimibe (ZETIA) 10 MG tablet Take 10 mg by mouth at bedtime.   . finasteride (PROSCAR) 5 MG tablet Take 1 tablet (5 mg total) by mouth daily.  . hydrochlorothiazide (MICROZIDE) 12.5 MG capsule Take 12.5 mg by mouth daily.  Marland Kitchen losartan (COZAAR) 100 MG tablet Take 100 mg by mouth daily.  . metFORMIN (GLUCOPHAGE) 1000 MG tablet Take 1,000 mg by mouth 2 (two) times daily with a meal.  .  metoprolol tartrate (LOPRESSOR) 25 MG tablet Take 25 mg by mouth at bedtime.  Marland Kitchen nystatin cream (MYCOSTATIN) Apply 1 application topically 2 (two) times daily. Apply to affected area sparingly 2 x daily  . Omega-3 Fatty Acids (FISH OIL) 1000 MG CAPS Take 1,000 mg by mouth 3 (three) times daily.  . Potassium Chloride CR (MICRO-K) 8 MEQ CPCR capsule CR Take 8 mEq by mouth at bedtime.  . pravastatin (PRAVACHOL) 40 MG tablet Take 40 mg by mouth daily.  . tadalafil (CIALIS) 5 MG tablet Take 5 mg by mouth every 3 (three) days.   . tamsulosin (FLOMAX) 0.4 MG CAPS capsule Take 1 capsule (0.4 mg total) by mouth daily.  Marland Kitchen topiramate (TOPAMAX) 50 MG tablet Take 50 mg by mouth 2 (two) times daily.   . TRULICITY 3.15 VV/6.1YW SOPN Inject 0.75 mg into the skin every Saturday.    No facility-administered encounter medications on file as of 07/13/2020.    Past Medical History:  Diagnosis Date  . Bladder outlet obstruction   . BPH (benign prostatic hyperplasia)   . Elevated PSA   . Feeling of incomplete bladder emptying   . Hyperlipidemia   .  Hypertension   . Type 2 diabetes mellitus ([place])   . Weak urinary stream     Past Surgical History:  Procedure Laterality Date  . CIRCUMCISION  2017  . COLONOSCOPY N/A 04/23/2018   Dr. Gala Romney: 48mm hyperplasitc sigmoid polyp removed. next colonoscopy in 10 years  . FLEXIBLE SIGMOIDOSCOPY N/A 07/19/2019   Procedure: FLEXIBLE SIGMOIDOSCOPY;  Surgeon: Daneil Dolin, MD;  Location: [facility];  Service: Endoscopy;  Laterality: N/A;  8:30am  . POLYPECTOMY  04/23/2018   Procedure: POLYPECTOMY;  Surgeon: Daneil Dolin, MD;  Location: [facility];  Service: Endoscopy;;    Social History   Socioeconomic History  . Marital status: Married    Spouse name: Not on file  . Number of children: Not on file  . Years of education: Not on file  . Highest education level: Not on file  Occupational History  . Not on file  Tobacco Use  . Smoking status: Never Smoker  . Smokeless tobacco: Never Used  Substance and Sexual Activity  . Alcohol use: No  . Drug use: No  . Sexual activity: Not on file  Other Topics Concern  . Not on file  Social History Narrative  . Not on file   Social Determinants of Health   Financial Resource Strain: Not on file  Food Insecurity: Not on file  Transportation Needs: Not on file  Physical Activity: Not on file  Stress: Not on file  Social Connections: Not on file  Intimate Partner Violence: Not on file    Family History  Problem Relation Age of Onset  . Diabetes Mother   . Heart attack Father   . Ovarian cancer Sister        Objective: Vitals:   07/13/20 1137  BP: 128/81  Pulse: 76  Temp: 98.1 F (36.7 C)     Physical Exam  Lab Results:  No results found for this or any previous visit (from the past 24 hour(s)).  BMET No results for input(s): NA, K, CL, CO2, GLUCOSE, BUN, CREATININE, CALCIUM in the last 72 hours. PSA No results found for: PSA No results found for: TESTOSTERONE    UA has 3+ glucose   Studies/Results: PVR is  537ml.    Assessment & Plan: BPH with BOO and incomplete emptying.  He has  persist LUTS on tamsulosin, tadalafil and finasteride and has a PVR of 565ml.  I discussed options with him and will get him set up for a TURP.   I have reviewed the risks of bleeding, infection, strictures, incontinence, sexual and ejaculatory dysfunction, thrombotic events and anesthetic complications.    History of UTI's.  His UA is clear today.  Balanitis.  He intermittently uses nystatin.     ED.  He has severe ED.   No orders of the defined types were placed in this encounter.    Orders Placed This Encounter  Procedures  . Urinalysis, Routine w reflex microscopic  . PSA, total and free    Standing Status:   Future    Standing Expiration Date:   08/13/2020  . Bladder Scan (Post Void Residual) in office      Return in about 6 weeks (around 08/24/2020) for He needs a TURP in January and then will need f/u 2-3 weeks later. .   CC: [name], MD      Irine Seal 07/13/2020

## 2020-07-13 ENCOUNTER — Other Ambulatory Visit: Payer: Self-pay

## 2020-07-13 ENCOUNTER — Encounter: Payer: Self-pay | Admitting: Urology

## 2020-07-13 ENCOUNTER — Ambulatory Visit (INDEPENDENT_AMBULATORY_CARE_PROVIDER_SITE_OTHER): Payer: 59 | Admitting: Urology

## 2020-07-13 VITALS — BP 128/81 | HR 76 | Temp 98.1°F | Ht 70.0 in | Wt 265.0 lb

## 2020-07-13 DIAGNOSIS — N3941 Urge incontinence: Secondary | ICD-10-CM

## 2020-07-13 DIAGNOSIS — R339 Retention of urine, unspecified: Secondary | ICD-10-CM | POA: Diagnosis not present

## 2020-07-13 DIAGNOSIS — N401 Enlarged prostate with lower urinary tract symptoms: Secondary | ICD-10-CM

## 2020-07-13 DIAGNOSIS — N138 Other obstructive and reflux uropathy: Secondary | ICD-10-CM

## 2020-07-13 LAB — BLADDER SCAN AMB NON-IMAGING: Scan Result: 547

## 2020-07-13 LAB — URINALYSIS, ROUTINE W REFLEX MICROSCOPIC
Bilirubin, UA: NEGATIVE
Ketones, UA: NEGATIVE
Leukocytes,UA: NEGATIVE
Nitrite, UA: NEGATIVE
Protein,UA: NEGATIVE
RBC, UA: NEGATIVE
Specific Gravity, UA: 1.02 (ref 1.005–1.030)
Urobilinogen, Ur: 0.2 mg/dL (ref 0.2–1.0)
pH, UA: 5.5 (ref 5.0–7.5)

## 2020-07-13 NOTE — Patient Instructions (Signed)
Transurethral Resection of the Prostate Transurethral resection of the prostate (TURP) is the removal (resection) of part of the gland that produces semen (prostate gland). This procedure is done to treat benign prostatic hyperplasia (BPH). BPH is an abnormal, noncancerous (benign) increase in the number of cells that make up the prostate tissue. BPH causes the prostate to get bigger. The enlarged prostate can push against or block the tube that drains urine from the bladder out of the body (urethra). BPH can affect normal urine flow by causing bladder infections, difficulty controlling bladder function, and difficulty emptying the bladder.  The goal of TURP is to remove enough prostate tissue to allow for a normal flow of urine. The procedure will allow you to empty your bladder more completely when you urinate so that you can urinate less often. In a transurethral resection, a thin telescope with a light, a tiny camera, and an electric cutting edge (resectoscope) is passed through the urethra and into the prostate. The opening of the urethra is at the end of the penis. Tell a health care provider about:  Any allergies you have.  All medicines you are taking, including vitamins, herbs, eye drops, creams, and over-the-counter medicines.  Any problems you or family members have had with anesthetic medicines.  Any blood disorders you have.  Any surgeries you have had.  Any medical conditions you have.  Any prostate infections you have had. What are the risks? Generally, this is a safe procedure. However, problems may occur, including:  Infection.  Bleeding.  Allergic reactions to medicines.  Damage to other structures or organs, such as: ? The urethra. ? The bladder. ? Muscles that surround the prostate.  Difficulty getting an erection.  Inability to control when you urinate (incontinence).  Scarring, which may cause problems with urine flow. What happens before the  procedure? Medicines Ask your health care provider about:  Changing or stopping your regular medicines. This is especially important if you are taking diabetes medicines or blood thinners.  Taking medicines such as aspirin and ibuprofen. These medicines can thin your blood. Do not take these medicines unless your health care provider tells you to take them.  Taking over-the-counter medicines, vitamins, herbs, and supplements. Eating and drinking Follow instructions from your health care provider about eating and drinking, which may include:  8 hours before the procedure - stop eating heavy meals or foods, such as meat, fried foods, or fatty foods.  6 hours before the procedure - stop eating light meals or foods, such as toast or cereal.  6 hours before the procedure - stop drinking milk or drinks that contain milk.  2 hours before the procedure - stop drinking clear liquids. Staying hydrated Follow instructions from your health care provider about hydration, which may include:  Up to 2 hours before the procedure - you may continue to drink clear liquids, such as water, clear fruit juice, black coffee, and plain tea. General instructions  You may have a physical exam.  You may have a blood or urine sample taken.  Ask your health care provider what steps will be taken to help prevent infection. These may include: ? Washing skin with a germ-killing soap. ? Taking antibiotic medicine.  Plan to have someone take you home from the hospital or clinic. You may not be able to drive for up to 10 days after your procedure.  Plan to have a responsible adult care for you for at least 24 hours after you leave the hospital or   clinic. This is important. What happens during the procedure?   An IV will be inserted into one of your veins.  You will be given one or more of the following: ? A medicine to help you relax (sedative). ? A medicine to make you fall asleep (general anesthetic). ? A  medicine that is injected into your spine to numb the area below and slightly above the injection site (spinal anesthetic).  Your legs will be placed in foot rests (stirrups) so that your legs are apart and your knees are bent.  The resectoscope will be passed through your urethra to your prostate.  Parts of your prostate will be resected using the cutting edge of the resectoscope.  The resectoscope will be removed.  A small, thin tube (catheter) will be passed through your urethra and into your bladder. The catheter will drain urine into a bag outside of your body. ? Fluid may be passed through the catheter to keep the catheter open. The procedure may vary among health care providers and hospitals. What happens after the procedure?  Your blood pressure, heart rate, breathing rate, and blood oxygen level will be monitored until you leave the hospital or clinic.  You may continue to receive fluids and medicines through an IV.  You may have some pain. Pain medicine will be available to help you.  You will have a catheter draining your urine. ? You may have blood in your urine. Your catheter may be kept in until your urine is clear. ? Your urinary drainage will be monitored. If necessary, your bladder may be rinsed out (irrigated) through your catheter.  You will be encouraged to walk around as soon as possible.  You may have to wear compression stockings. These stockings help prevent blood clots and reduce swelling in your legs.  Do not drive for 24 hours if you were given a sedative during your procedure. Summary  Transurethral resection of the prostate (TURP) is the removal (resection) of part of the gland that produces semen (prostate gland).  The goal of this procedure is to remove enough prostate tissue to allow for a normal flow of urine.  Follow instructions from your health care provider about taking medicines and about eating and drinking before the procedure. This  information is not intended to replace advice given to you by your health care provider. Make sure you discuss any questions you have with your health care provider. Document Revised: 11/10/2018 Document Reviewed: 04/21/2018 Elsevier Patient Education  2020 [employer]  

## 2020-07-13 NOTE — Progress Notes (Signed)
Urological Symptom Review  Patient is experiencing the following symptoms: Hard to postpone urination Get up at night to urinate Leakage of urine Stream starts and stops Weak stream Erection problems (male only)   Review of Systems  Gastrointestinal (upper)  : Negative for upper GI symptoms  Gastrointestinal (lower) : Diarrhea  Constitutional : Fatigue  Skin: Negative for skin symptoms  Eyes: Blurred vision  Ear/Nose/Throat : Negative for Ear/Nose/Throat symptoms  Hematologic/Lymphatic: Easy bruising  Cardiovascular : Leg swelling Chest pain Negative for cardiovascular symptoms  Respiratory : Cough Shortness of breath Negative for respiratory symptoms  Endocrine: Excessive thirst  Musculoskeletal: Joint pain  Neurological: Negative for neurological symptoms  Psychologic: Negative for psychiatric symptoms

## 2020-08-13 ENCOUNTER — Telehealth: Payer: Self-pay

## 2020-08-13 NOTE — Telephone Encounter (Signed)
Please see note.

## 2020-08-13 NOTE — Telephone Encounter (Signed)
-----   Message from Irine Seal, MD sent at 08/09/2020  4:43 PM EST ----- Regarding: RE: surgery questions Let him know that I don't think the laser adds value and in my experience the TURP is more reliably effective than the laser ablation treatments with more rapid normalization of urinary symptoms.  There are a couple of different types of laser procedures and the only one I might recommend would be for a situation where the prostate is much larger than his.  If he wants to talk about it further before scheduling, please get him an appointment with me.   ----- Message ----- From: Dorisann Frames, RN Sent: 08/09/2020   3:08 PM EST To: Irine Seal, MD Subject: surgery questions                              I called this man to talk about surgery dates.  He reported his PCP suggested he have a surgery for his BPH with the "laser" instead.  You have written on your posting for TURP. I wasn't 100% sure what to tell him.

## 2020-08-14 NOTE — Telephone Encounter (Signed)
I spoke to Dr. Sherrie Sport and he didn't have a preference I think he was discussing what was available.    The procedure he mentioned is the HOLEP, holmium laser enucleation of the prostate.   This is a procedure that I refer to one of my partners in [place] or [place] for and I generally suggest it if the prostate is a lot bigger than Bradley Hurst.   It is an option for him and the risks are similar to the TURP.   It is an outpatient procedure but the catheter time and recovery are going to be similar.    I am most comfortable with the TURP but if he would like to consider the HOLEP, I could send him to see one of our folks in [place] or in [place] to discuss it further.   I don't think Dr. Sherrie Sport really has an opinion about which he chooses.

## 2020-08-22 NOTE — Telephone Encounter (Signed)
Patient would like to be referred for [facility]

## 2020-08-23 ENCOUNTER — Encounter: Payer: Self-pay | Admitting: Urology

## 2020-08-23 ENCOUNTER — Other Ambulatory Visit: Payer: Self-pay | Admitting: Urology

## 2020-08-23 DIAGNOSIS — N401 Enlarged prostate with lower urinary tract symptoms: Secondary | ICD-10-CM

## 2020-08-23 DIAGNOSIS — N138 Other obstructive and reflux uropathy: Secondary | ICD-10-CM

## 2020-08-23 NOTE — Care Plan (Signed)
Bradley Hurst has decided he would like a HOLEP.  I will have him referred to [facility] for consideration of that procedure.

## 2020-08-23 NOTE — Telephone Encounter (Signed)
I put in an order for a referral to [facility] urology to see Dr. Diamantina Providence or Dr. Erlene Quan.

## 2020-09-11 ENCOUNTER — Encounter: Payer: Self-pay | Admitting: Urology

## 2020-09-11 ENCOUNTER — Other Ambulatory Visit: Payer: Self-pay | Admitting: Radiology

## 2020-09-11 ENCOUNTER — Other Ambulatory Visit: Payer: Self-pay

## 2020-09-11 ENCOUNTER — Ambulatory Visit (INDEPENDENT_AMBULATORY_CARE_PROVIDER_SITE_OTHER): Payer: 59 | Admitting: Urology

## 2020-09-11 VITALS — BP 132/82 | HR 81 | Ht 70.0 in | Wt 265.0 lb

## 2020-09-11 DIAGNOSIS — Z87438 Personal history of other diseases of male genital organs: Secondary | ICD-10-CM | POA: Diagnosis not present

## 2020-09-11 DIAGNOSIS — N401 Enlarged prostate with lower urinary tract symptoms: Secondary | ICD-10-CM

## 2020-09-11 DIAGNOSIS — N471 Phimosis: Secondary | ICD-10-CM

## 2020-09-11 DIAGNOSIS — N138 Other obstructive and reflux uropathy: Secondary | ICD-10-CM

## 2020-09-11 DIAGNOSIS — R339 Retention of urine, unspecified: Secondary | ICD-10-CM

## 2020-09-11 LAB — URINALYSIS, COMPLETE
Bilirubin, UA: NEGATIVE
Ketones, UA: NEGATIVE
Leukocytes,UA: NEGATIVE
Nitrite, UA: NEGATIVE
Protein,UA: NEGATIVE
RBC, UA: NEGATIVE
Specific Gravity, UA: 1.02 (ref 1.005–1.030)
Urobilinogen, Ur: 0.2 mg/dL (ref 0.2–1.0)
pH, UA: 5.5 (ref 5.0–7.5)

## 2020-09-11 LAB — MICROSCOPIC EXAMINATION: Bacteria, UA: NONE SEEN

## 2020-09-11 LAB — BLADDER SCAN AMB NON-IMAGING

## 2020-09-11 NOTE — Patient Instructions (Signed)

## 2020-09-11 NOTE — Addendum Note (Signed)
Addended by: Maryln Gottron on: 09/11/2020 02:57 PM   Modules accepted: Orders

## 2020-09-11 NOTE — H&P (View-Only) (Signed)
09/11/2020 2:49 PM   [name] 1960/09/01 [id number]  Referring provider: Irine Seal, MD [address] [place],  [place] [address]  Chief Complaint  Patient presents with  . Benign Prostatic Hypertrophy    HPI: 60 year old male who presents today for further evaluation of BPH considering aloe procedure.  He was recently seen and evaluated by Dr. Irine Seal in [place].  He has severe refractory symptoms on maximal medical therapy including Flomax and finasteride.  He is also on tadalafil 5 mg daily.  IPSS as below.  His PVRs have been elevated to over 500 cc.  He underwent extensive evaluation including cystoscopy which showed 3 cm prostate with bilobar coaptation without a significant median lobe.  Estimated prostate volume on CT scan on 03/2019 was 58 cc.  He also mentions today that he has a personal history of UTI as well as severe phimosis.  He underwent what sounds like at bedside dorsal slit procedure.  He does not like the cosmesis and his wife complains because his foreskin will bunched up.  Also frequently becomes inflamed and irritated.  He would like to consider circumcision at the time of any other procedure.  We do not have a recent PSA.  1 was ordered by Dr. Jeffie Pollock but it looks like it was never performed.  We will request this from his PCP.   IPSS    Row Name 09/11/20 1400         International Prostate Symptom Score   How often have you had the sensation of not emptying your bladder? Almost always     How often have you had to urinate less than every two hours? More than half the time     How often have you found you stopped and started again several times when you urinated? More than half the time     How often have you found it difficult to postpone urination? Almost always     How often have you had a weak urinary stream? Less than half the time     How often have you had to strain to start urination? Less than half the time     Total IPSS Score 22            Quality of Life due to urinary symptoms   If you were to spend the rest of your life with your urinary condition just the way it is now how would you feel about that? Terrible            Score:  1-7 Mild 8-19 Moderate 20-35 Severe    PMH: Past Medical History:  Diagnosis Date  . Bladder outlet obstruction   . BPH (benign prostatic hyperplasia)   . Elevated PSA   . Feeling of incomplete bladder emptying   . Hyperlipidemia   . Hypertension   . Type 2 diabetes mellitus ([place])   . Weak urinary stream     Surgical History: Past Surgical History:  Procedure Laterality Date  . CIRCUMCISION  2017  . COLONOSCOPY N/A 04/23/2018   Dr. Gala Romney: 92mm hyperplasitc sigmoid polyp removed. next colonoscopy in 10 years  . FLEXIBLE SIGMOIDOSCOPY N/A 07/19/2019   Procedure: FLEXIBLE SIGMOIDOSCOPY;  Surgeon: Daneil Dolin, MD;  [facility];  Service: Endoscopy;  Laterality: N/A;  8:30am  . POLYPECTOMY  04/23/2018   Procedure: POLYPECTOMY;  Surgeon: Daneil Dolin, MD;  [facility];  Service: Endoscopy;;    Home Medications:  Allergies as of 09/11/2020  No Known Allergies     Medication List       Accurate as of September 11, 2020  2:49 PM. If you have any questions, ask your nurse or doctor.        STOP taking these medications   tamsulosin 0.4 MG Caps capsule Commonly known as: FLOMAX Stopped by: Hollice Espy, MD     TAKE these medications   acarbose 100 MG tablet Commonly known as: PRECOSE Take 100 mg by mouth 3 (three) times daily.   amLODipine 10 MG tablet Commonly known as: NORVASC Take 10 mg by mouth at bedtime.   calcium carbonate 500 MG chewable tablet Commonly known as: TUMS - dosed in mg elemental calcium Chew 1 tablet by mouth daily as needed for indigestion or heartburn.   empagliflozin 25 MG Tabs tablet Commonly known as: JARDIANCE Take 25 mg by mouth daily.   ezetimibe 10 MG tablet Commonly known as: ZETIA Take 10 mg by mouth  at bedtime.   finasteride 5 MG tablet Commonly known as: PROSCAR Take 1 tablet (5 mg total) by mouth daily.   Fish Oil 1000 MG Caps Take 1,000 mg by mouth 3 (three) times daily.   hydrochlorothiazide 12.5 MG capsule Commonly known as: MICROZIDE Take 12.5 mg by mouth daily.   losartan 100 MG tablet Commonly known as: COZAAR Take 100 mg by mouth daily.   metFORMIN 1000 MG tablet Commonly known as: GLUCOPHAGE Take 1,000 mg by mouth 2 (two) times daily with a meal.   metoprolol tartrate 25 MG tablet Commonly known as: LOPRESSOR Take 25 mg by mouth at bedtime.   nystatin cream Commonly known as: MYCOSTATIN Apply 1 application topically 2 (two) times daily. Apply to affected area sparingly 2 x daily   Potassium Chloride CR 8 MEQ Cpcr capsule CR Commonly known as: MICRO-K Take 8 mEq by mouth at bedtime.   pravastatin 40 MG tablet Commonly known as: PRAVACHOL Take 40 mg by mouth daily.   tadalafil 5 MG tablet Commonly known as: CIALIS Take 5 mg by mouth every 3 (three) days.   topiramate 50 MG tablet Commonly known as: TOPAMAX Take 50 mg by mouth 2 (two) times daily.   Trulicity 2.20 UR/4.2HC Sopn Generic drug: Dulaglutide Inject 0.75 mg into the skin every Saturday.       Allergies: No Known Allergies  Family History: Family History  Problem Relation Age of Onset  . Diabetes Mother   . Heart attack Father   . Ovarian cancer Sister     Social History:  reports that he has never smoked. He has never used smokeless tobacco. He reports that he does not drink alcohol and does not use drugs.   Physical Exam: BP 132/82   Pulse 81   Ht 5\' 10"  (1.778 m)   Wt 265 lb (120.2 kg)   BMI 38.02 kg/m   Constitutional:  Alert and oriented, No acute distress. HEENT: [place] AT, moist mucus membranes.  Trachea midline, no masses. Cardiovascular: No clubbing, cyanosis, or edema. Respiratory: Normal respiratory effort, no increased work of breathing. GI: Abdomen is soft,  nontender, nondistended, no abdominal masses GU: Uncircumcised phallus with inflamed thickened foreskin and dorsal slit formation.   Skin: No rashes, bruises or suspicious lesions. Neurologic: Grossly intact, no focal deficits, moving all 4 extremities. Psychiatric: Normal mood and affect.  Laboratory Data: Lab Results  Component Value Date   WBC 4.6 03/20/2016   HGB 13.3 03/20/2016   HCT 39.0 03/20/2016   MCV 84.1 03/20/2016  PLT 126 (L) 03/20/2016    Lab Results  Component Value Date   CREATININE 0.60 (L) 03/20/2016     Lab Results  Component Value Date   HGBA1C 7.7 (H) 03/17/2016    Urinalysis pending  Pertinent Imaging: Results for orders placed or performed in visit on 09/11/20  Bladder Scan (Post Void Residual) in office  Result Value Ref Range   Scan Result 675ml     Assessment & Plan:    1. 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  We discussed the risk and benefits of each of these procedures at length today.  He is concerned about sexual side effects and I assured them that none of these procedures will necessarily impact his ability to maintain and achieve erections but certainly TURP and holep could impact his ability to ejaculate permanently.  He continues to be most interested in holep despite his prostate being only moderately enlarged.  We reviewed the surgery in detail today including the preoperative, intraoperative, and postoperative course.  This will most likely be an outpatient procedure pending the degree of post op hematuria.  He will go home with catheter for a few days post op and will either be taught how to remove his own catheter or return to the office for catheter removal.  Risk of bleeding, infection, damage surrounding structures, injury to the bladder/ urethral,  bladder neck contracture, ureteral stricture, retrograde ejaculation, stress/ urge incontinence, exacerbation of irritative voiding symptoms were all discussed in detail.    We will check a PSA with his preoperative labs, anticipate that will be mildly elevated based on his chronic retention, primarily due to rule out advanced metastatic prostate cancer.  - Urinalysis, Complete - Bladder Scan (Post Void Residual) in office - CULTURE, URINE COMPREHENSIVE  2. Chronic retention of urine BMP today, address outlet as above  Retention precautions  3. History of phimosis of penis Poor cosmesis with history of previous dorsal slit with ongoing irritation inflammation of his foreskin  Recommended consideration of circumcision at the time of outlet procedure which he strongly desires.  We discussed risk including risk of bleeding, infection, demonstrating structures, impaired cosmesis amongst others.  We discussed wound care.  All questions answered.    Hollice Espy, MD  [facility] [address], [place], [place] [address] [phone]

## 2020-09-11 NOTE — Progress Notes (Signed)
09/11/2020 2:49 PM   [name] Mar 30, 1961 [id number]  Referring provider: Irine Seal, MD [address] [place],  [place] [address]  Chief Complaint  Patient presents with  . Benign Prostatic Hypertrophy    HPI: 60 year old male who presents today for further evaluation of BPH considering aloe procedure.  He was recently seen and evaluated by Dr. Irine Seal in [place].  He has severe refractory symptoms on maximal medical therapy including Flomax and finasteride.  He is also on tadalafil 5 mg daily.  IPSS as below.  His PVRs have been elevated to over 500 cc.  He underwent extensive evaluation including cystoscopy which showed 3 cm prostate with bilobar coaptation without a significant median lobe.  Estimated prostate volume on CT scan on 03/2019 was 58 cc.  He also mentions today that he has a personal history of UTI as well as severe phimosis.  He underwent what sounds like at bedside dorsal slit procedure.  He does not like the cosmesis and his wife complains because his foreskin will bunched up.  Also frequently becomes inflamed and irritated.  He would like to consider circumcision at the time of any other procedure.  We do not have a recent PSA.  1 was ordered by Dr. Jeffie Pollock but it looks like it was never performed.  We will request this from his PCP.   IPSS    Row Name 09/11/20 1400         International Prostate Symptom Score   How often have you had the sensation of not emptying your bladder? Almost always     How often have you had to urinate less than every two hours? More than half the time     How often have you found you stopped and started again several times when you urinated? More than half the time     How often have you found it difficult to postpone urination? Almost always     How often have you had a weak urinary stream? Less than half the time     How often have you had to strain to start urination? Less than half the time     Total IPSS Score 22            Quality of Life due to urinary symptoms   If you were to spend the rest of your life with your urinary condition just the way it is now how would you feel about that? Terrible            Score:  1-7 Mild 8-19 Moderate 20-35 Severe    PMH: Past Medical History:  Diagnosis Date  . Bladder outlet obstruction   . BPH (benign prostatic hyperplasia)   . Elevated PSA   . Feeling of incomplete bladder emptying   . Hyperlipidemia   . Hypertension   . Type 2 diabetes mellitus ([place])   . Weak urinary stream     Surgical History: Past Surgical History:  Procedure Laterality Date  . CIRCUMCISION  2017  . COLONOSCOPY N/A 04/23/2018   Dr. Gala Romney: 57mm hyperplasitc sigmoid polyp removed. next colonoscopy in 10 years  . FLEXIBLE SIGMOIDOSCOPY N/A 07/19/2019   Procedure: FLEXIBLE SIGMOIDOSCOPY;  Surgeon: Daneil Dolin, MD;  [facility];  Service: Endoscopy;  Laterality: N/A;  8:30am  . POLYPECTOMY  04/23/2018   Procedure: POLYPECTOMY;  Surgeon: Daneil Dolin, MD;  [facility];  Service: Endoscopy;;    Home Medications:  Allergies as of 09/11/2020  No Known Allergies     Medication List       Accurate as of September 11, 2020  2:49 PM. If you have any questions, ask your nurse or doctor.        STOP taking these medications   tamsulosin 0.4 MG Caps capsule Commonly known as: FLOMAX Stopped by: Hollice Espy, MD     TAKE these medications   acarbose 100 MG tablet Commonly known as: PRECOSE Take 100 mg by mouth 3 (three) times daily.   amLODipine 10 MG tablet Commonly known as: NORVASC Take 10 mg by mouth at bedtime.   calcium carbonate 500 MG chewable tablet Commonly known as: TUMS - dosed in mg elemental calcium Chew 1 tablet by mouth daily as needed for indigestion or heartburn.   empagliflozin 25 MG Tabs tablet Commonly known as: JARDIANCE Take 25 mg by mouth daily.   ezetimibe 10 MG tablet Commonly known as: ZETIA Take 10 mg by mouth  at bedtime.   finasteride 5 MG tablet Commonly known as: PROSCAR Take 1 tablet (5 mg total) by mouth daily.   Fish Oil 1000 MG Caps Take 1,000 mg by mouth 3 (three) times daily.   hydrochlorothiazide 12.5 MG capsule Commonly known as: MICROZIDE Take 12.5 mg by mouth daily.   losartan 100 MG tablet Commonly known as: COZAAR Take 100 mg by mouth daily.   metFORMIN 1000 MG tablet Commonly known as: GLUCOPHAGE Take 1,000 mg by mouth 2 (two) times daily with a meal.   metoprolol tartrate 25 MG tablet Commonly known as: LOPRESSOR Take 25 mg by mouth at bedtime.   nystatin cream Commonly known as: MYCOSTATIN Apply 1 application topically 2 (two) times daily. Apply to affected area sparingly 2 x daily   Potassium Chloride CR 8 MEQ Cpcr capsule CR Commonly known as: MICRO-K Take 8 mEq by mouth at bedtime.   pravastatin 40 MG tablet Commonly known as: PRAVACHOL Take 40 mg by mouth daily.   tadalafil 5 MG tablet Commonly known as: CIALIS Take 5 mg by mouth every 3 (three) days.   topiramate 50 MG tablet Commonly known as: TOPAMAX Take 50 mg by mouth 2 (two) times daily.   Trulicity 4.19 FX/9.0WI Sopn Generic drug: Dulaglutide Inject 0.75 mg into the skin every Saturday.       Allergies: No Known Allergies  Family History: Family History  Problem Relation Age of Onset  . Diabetes Mother   . Heart attack Father   . Ovarian cancer Sister     Social History:  reports that he has never smoked. He has never used smokeless tobacco. He reports that he does not drink alcohol and does not use drugs.   Physical Exam: BP 132/82   Pulse 81   Ht 5\' 10"  (1.778 m)   Wt 265 lb (120.2 kg)   BMI 38.02 kg/m   Constitutional:  Alert and oriented, No acute distress. HEENT: [place] AT, moist mucus membranes.  Trachea midline, no masses. Cardiovascular: No clubbing, cyanosis, or edema. Respiratory: Normal respiratory effort, no increased work of breathing. GI: Abdomen is soft,  nontender, nondistended, no abdominal masses GU: Uncircumcised phallus with inflamed thickened foreskin and dorsal slit formation.   Skin: No rashes, bruises or suspicious lesions. Neurologic: Grossly intact, no focal deficits, moving all 4 extremities. Psychiatric: Normal mood and affect.  Laboratory Data: Lab Results  Component Value Date   WBC 4.6 03/20/2016   HGB 13.3 03/20/2016   HCT 39.0 03/20/2016   MCV 84.1 03/20/2016  PLT 126 (L) 03/20/2016    Lab Results  Component Value Date   CREATININE 0.60 (L) 03/20/2016     Lab Results  Component Value Date   HGBA1C 7.7 (H) 03/17/2016    Urinalysis pending  Pertinent Imaging: Results for orders placed or performed in visit on 09/11/20  Bladder Scan (Post Void Residual) in office  Result Value Ref Range   Scan Result 652ml     Assessment & Plan:    1. 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  We discussed the risk and benefits of each of these procedures at length today.  He is concerned about sexual side effects and I assured them that none of these procedures will necessarily impact his ability to maintain and achieve erections but certainly TURP and holep could impact his ability to ejaculate permanently.  He continues to be most interested in holep despite his prostate being only moderately enlarged.  We reviewed the surgery in detail today including the preoperative, intraoperative, and postoperative course.  This will most likely be an outpatient procedure pending the degree of post op hematuria.  He will go home with catheter for a few days post op and will either be taught how to remove his own catheter or return to the office for catheter removal.  Risk of bleeding, infection, damage surrounding structures, injury to the bladder/ urethral,  bladder neck contracture, ureteral stricture, retrograde ejaculation, stress/ urge incontinence, exacerbation of irritative voiding symptoms were all discussed in detail.    We will check a PSA with his preoperative labs, anticipate that will be mildly elevated based on his chronic retention, primarily due to rule out advanced metastatic prostate cancer.  - Urinalysis, Complete - Bladder Scan (Post Void Residual) in office - CULTURE, URINE COMPREHENSIVE  2. Chronic retention of urine BMP today, address outlet as above  Retention precautions  3. History of phimosis of penis Poor cosmesis with history of previous dorsal slit with ongoing irritation inflammation of his foreskin  Recommended consideration of circumcision at the time of outlet procedure which he strongly desires.  We discussed risk including risk of bleeding, infection, demonstrating structures, impaired cosmesis amongst others.  We discussed wound care.  All questions answered.    Hollice Espy, MD  [facility] [address], [place], [place] [address] [phone]

## 2020-09-12 ENCOUNTER — Telehealth: Payer: Self-pay | Admitting: *Deleted

## 2020-09-12 ENCOUNTER — Telehealth: Payer: Self-pay | Admitting: Radiology

## 2020-09-12 LAB — BASIC METABOLIC PANEL
BUN/Creatinine Ratio: 16 (ref 10–24)
BUN: 15 mg/dL (ref 8–27)
CO2: 21 mmol/L (ref 20–29)
Calcium: 9.4 mg/dL (ref 8.6–10.2)
Chloride: 103 mmol/L (ref 96–106)
Creatinine, Ser: 0.93 mg/dL (ref 0.76–1.27)
GFR calc Af Amer: 103 mL/min/{1.73_m2} (ref >59)
GFR calc non Af Amer: 89 mL/min/{1.73_m2} (ref >59)
Glucose: 283 mg/dL — ABNORMAL HIGH (ref 65–99)
Potassium: 4.3 mmol/L (ref 3.5–5.2)
Sodium: 139 mmol/L (ref 134–144)

## 2020-09-12 LAB — PSA: Prostate Specific Ag, Serum: 0.2 ng/mL (ref 0.0–4.0)

## 2020-09-12 NOTE — Telephone Encounter (Signed)
Unable to reach patient by phone to advise of phone call from pre-admission testing on 09/19/2020 between 1-5pm. Mailbox is full.

## 2020-09-12 NOTE — Telephone Encounter (Addendum)
Patient informed, voiced understanding. Patient's PCP aware of elevated glucose-trying to adjust medications.   ----- Message from Hollice Espy, MD sent at 09/12/2020 10:17 AM EST ----- Please let this patient know that his PSA and kidney function labs look fine.  His blood sugar was quite elevated however at 283.  He is to follow-up with his PCP to work on blood glucose control especially with upcoming surgery.  Having uncontrolled blood sugars can increase the risk of infection complications.  Hollice Espy, MD

## 2020-09-14 LAB — CULTURE, URINE COMPREHENSIVE

## 2020-09-17 ENCOUNTER — Other Ambulatory Visit: Payer: Self-pay

## 2020-09-17 ENCOUNTER — Other Ambulatory Visit: Payer: Self-pay | Admitting: Radiology

## 2020-09-17 ENCOUNTER — Other Ambulatory Visit: Payer: 59

## 2020-09-17 DIAGNOSIS — N138 Other obstructive and reflux uropathy: Secondary | ICD-10-CM

## 2020-09-17 DIAGNOSIS — N471 Phimosis: Secondary | ICD-10-CM

## 2020-09-17 DIAGNOSIS — N401 Enlarged prostate with lower urinary tract symptoms: Secondary | ICD-10-CM

## 2020-09-17 LAB — MICROSCOPIC EXAMINATION

## 2020-09-17 LAB — URINALYSIS, COMPLETE
Bilirubin, UA: NEGATIVE
Leukocytes,UA: NEGATIVE
Nitrite, UA: NEGATIVE
Protein,UA: NEGATIVE
RBC, UA: NEGATIVE
Specific Gravity, UA: 1.02 (ref 1.005–1.030)
Urobilinogen, Ur: 0.2 mg/dL (ref 0.2–1.0)
pH, UA: 6.5 (ref 5.0–7.5)

## 2020-09-19 ENCOUNTER — Other Ambulatory Visit
Admission: RE | Admit: 2020-09-19 | Discharge: 2020-09-19 | Disposition: A | Discharge status: Home / Self Care | Payer: 59 | Source: Ambulatory Visit | Attending: Urology | Admitting: Urology

## 2020-09-19 ENCOUNTER — Other Ambulatory Visit: Payer: Self-pay

## 2020-09-19 NOTE — Patient Instructions (Signed)
Your procedure is scheduled on: Monday September 24, 2020. Report to Day Surgery inside [facility] 2nd floor (stop by Admissions desk first before getting on Elevator). To find out your arrival time please call [phone] between 1PM - 3PM on Friday September 21, 2020.  Remember: Instructions that are not followed completely may result in serious medical risk,  up to and including death, or upon the discretion of your surgeon and anesthesiologist your  surgery may need to be rescheduled.     _X__ 1. Do not eat food or drink fluids after midnight the night before your procedure.                 No chewing gum or hard candies.   __X__2.  On the morning of surgery brush your teeth with toothpaste and water, you                may rinse your mouth with mouthwash if you wish.  Do not swallow any toothpaste of mouthwash.     _X__ 3.  No Alcohol for 24 hours before or after surgery.   _X__ 4.  Do Not Smoke or use e-cigarettes For 24 Hours Prior to Your Surgery.                 Do not use any chewable tobacco products for at least 6 hours prior to                 Surgery.  _X__  5.  Do not use any recreational drugs (marijuana, cocaine, heroin, ecstasy, MDMA or other)                For at least one week prior to your surgery.  Combination of these drugs with anesthesia                May have life threatening results.   __X__ 6.  Notify your doctor if there is any change in your medical condition      (cold, fever, infections).     Do not wear jewelry, make-up, hairpins, clips or nail polish. Do not wear lotions, powders, or perfumes. You may wear deodorant. Do not shave 48 hours prior to surgery. Men may shave face and neck. Do not bring valuables to the hospital.    [facility] is not responsible for any belongings or valuables.  Contacts, dentures or bridgework may not be worn into surgery. Leave your suitcase in the car. After surgery it may be brought to  your room. For patients admitted to the hospital, discharge time is determined by your treatment team.   Patients discharged the day of surgery will not be allowed to drive home.   Make arrangements for someone to be with you for the first 24 hours of your Same Day Discharge.   __X__ Take these medicines only the morning of surgery with A SIP OF WATER:    1. finasteride (PROSCAR) 5 MG  2. topiramate (TOPAMAX) 50 MG  3. pravastatin (PRAVACHOL) 40 MG    ____ Fleet Enema (as directed)   ____ Use CHG Soap (or wipes) as directed  ____ Use Benzoyl Peroxide Gel as instructed  ____ Use inhalers on the day of surgery  __X__ Stop metformin 2 days prior to surgery (last dose will be Friday September 21, 2020)   ____ Take 1/2 of usual insulin dose the night before surgery. No insulin the morning          of surgery.  __X__ Stop Anti-inflammatories such as Ibuprofen, Aleve, Advil, naproxen, aspirin and or BC powders.    __X__ Stop supplements until after surgery.    __X__ Do not start any herbal supplements before your procedure.    If you have any questions regarding your pre-procedure instructions,  Please call Pre-admit Testing at [phone].

## 2020-09-20 ENCOUNTER — Encounter
Admission: RE | Admit: 2020-09-20 | Discharge: 2020-09-20 | Disposition: A | Discharge status: Home / Self Care | Payer: 59 | Source: Ambulatory Visit | Attending: Urology | Admitting: Urology

## 2020-09-20 ENCOUNTER — Other Ambulatory Visit: Payer: 59

## 2020-09-20 DIAGNOSIS — Z01818 Encounter for other preprocedural examination: Secondary | ICD-10-CM | POA: Diagnosis not present

## 2020-09-20 DIAGNOSIS — Z20822 Contact with and (suspected) exposure to covid-19: Secondary | ICD-10-CM | POA: Diagnosis not present

## 2020-09-20 LAB — CULTURE, URINE COMPREHENSIVE

## 2020-09-20 LAB — CBC
HCT: 48.6 % (ref 39.0–52.0)
Hemoglobin: 16.7 g/dL (ref 13.0–17.0)
MCH: 29.1 pg (ref 26.0–34.0)
MCHC: 34.4 g/dL (ref 30.0–36.0)
MCV: 84.7 fL (ref 80.0–100.0)
Platelets: 229 10*3/uL (ref 150–400)
RBC: 5.74 MIL/uL (ref 4.22–5.81)
RDW: 13.3 % (ref 11.5–15.5)
WBC: 7.9 10*3/uL (ref 4.0–10.5)
nRBC: 0 % (ref 0.0–0.2)

## 2020-09-20 LAB — SARS CORONAVIRUS 2 (TAT 6-24 HRS): SARS Coronavirus 2: NEGATIVE

## 2020-09-23 MED ORDER — CEFAZOLIN SODIUM-DEXTROSE 2-4 GM/100ML-% IV SOLN
2.0000 g | INTRAVENOUS | Status: AC
  Administered 2020-09-24: 2 g via INTRAVENOUS

## 2020-09-23 MED ORDER — CHLORHEXIDINE GLUCONATE 0.12 % MT SOLN: 15.0000 mL | Freq: Once | OROMUCOSAL | Status: AC

## 2020-09-23 MED ORDER — SODIUM CHLORIDE 0.9 % IV SOLN: INTRAVENOUS | Status: DC

## 2020-09-23 MED ORDER — ORAL CARE MOUTH RINSE: 15.0000 mL | Freq: Once | OROMUCOSAL | Status: AC

## 2020-09-24 ENCOUNTER — Ambulatory Visit
Admission: RE | Admit: 2020-09-24 | Discharge: 2020-09-24 | Disposition: A | Discharge status: Home / Self Care | Payer: 59 | Attending: Urology | Admitting: Urology

## 2020-09-24 ENCOUNTER — Other Ambulatory Visit: Payer: Self-pay

## 2020-09-24 ENCOUNTER — Encounter
Admission: RE | Disposition: A | Discharge status: Home / Self Care | Payer: Self-pay | Source: Home / Self Care | Attending: Urology

## 2020-09-24 ENCOUNTER — Ambulatory Visit: Payer: 59

## 2020-09-24 ENCOUNTER — Ambulatory Visit: Payer: 59 | Admitting: Registered Nurse

## 2020-09-24 ENCOUNTER — Encounter: Payer: Self-pay | Admitting: Urology

## 2020-09-24 DIAGNOSIS — Z7984 Long term (current) use of oral hypoglycemic drugs: Secondary | ICD-10-CM | POA: Diagnosis not present

## 2020-09-24 DIAGNOSIS — Z79899 Other long term (current) drug therapy: Secondary | ICD-10-CM | POA: Insufficient documentation

## 2020-09-24 DIAGNOSIS — R338 Other retention of urine: Secondary | ICD-10-CM | POA: Insufficient documentation

## 2020-09-24 DIAGNOSIS — N401 Enlarged prostate with lower urinary tract symptoms: Secondary | ICD-10-CM

## 2020-09-24 DIAGNOSIS — N3289 Other specified disorders of bladder: Secondary | ICD-10-CM | POA: Diagnosis not present

## 2020-09-24 DIAGNOSIS — N320 Bladder-neck obstruction: Secondary | ICD-10-CM

## 2020-09-24 DIAGNOSIS — N471 Phimosis: Secondary | ICD-10-CM

## 2020-09-24 DIAGNOSIS — N138 Other obstructive and reflux uropathy: Secondary | ICD-10-CM

## 2020-09-24 HISTORY — PX: HOLEP-LASER ENUCLEATION OF THE PROSTATE WITH MORCELLATION: SHX6641

## 2020-09-24 HISTORY — PX: CIRCUMCISION: SHX1350

## 2020-09-24 LAB — GLUCOSE, CAPILLARY
Glucose-Capillary: 173 mg/dL — ABNORMAL HIGH (ref 70–99)
Glucose-Capillary: 210 mg/dL — ABNORMAL HIGH (ref 70–99)
Glucose-Capillary: 244 mg/dL — ABNORMAL HIGH (ref 70–99)
Glucose-Capillary: 232 mg/dL — ABNORMAL HIGH (ref 70–99)

## 2020-09-24 SURGERY — ENUCLEATION, PROSTATE, USING LASER, WITH MORCELLATION
Anesthesia: General

## 2020-09-24 MED ORDER — HYDROCODONE-ACETAMINOPHEN 5-325 MG PO TABS: 1.0000 | ORAL_TABLET | Freq: Four times a day (QID) | ORAL | 0 refills | Status: DC | PRN

## 2020-09-24 MED ORDER — INSULIN ASPART 100 UNIT/ML ~~LOC~~ SOLN
5.0000 [IU] | Freq: Once | SUBCUTANEOUS | Status: AC
  Administered 2020-09-24: 5 [IU] via SUBCUTANEOUS

## 2020-09-24 MED ORDER — INSULIN ASPART 100 UNIT/ML ~~LOC~~ SOLN
SUBCUTANEOUS | Status: AC
  Administered 2020-09-24: 25 [IU]
  Filled 2020-09-24: qty 1

## 2020-09-24 MED ORDER — FENTANYL CITRATE (PF) 100 MCG/2ML IJ SOLN
25.0000 ug | INTRAMUSCULAR | Status: DC | PRN
Start: 2020-09-24 — End: 2020-09-24
  Administered 2020-09-24: 25 ug via INTRAVENOUS

## 2020-09-24 MED ORDER — ONDANSETRON HCL 4 MG/2ML IJ SOLN
INTRAMUSCULAR | Status: DC | PRN
  Administered 2020-09-24: 4 mg via INTRAVENOUS

## 2020-09-24 MED ORDER — ACETAMINOPHEN 10 MG/ML IV SOLN
INTRAVENOUS | Status: AC
  Filled 2020-09-24: qty 100

## 2020-09-24 MED ORDER — ONDANSETRON HCL 4 MG/2ML IJ SOLN: 4.0000 mg | Freq: Once | INTRAMUSCULAR | Status: DC | PRN

## 2020-09-24 MED ORDER — MIDAZOLAM HCL 2 MG/2ML IJ SOLN
INTRAMUSCULAR | Status: DC | PRN
  Administered 2020-09-24: 2 mg via INTRAVENOUS

## 2020-09-24 MED ORDER — SUCCINYLCHOLINE CHLORIDE 20 MG/ML IJ SOLN
INTRAMUSCULAR | Status: DC | PRN
  Administered 2020-09-24: 120 mg via INTRAVENOUS

## 2020-09-24 MED ORDER — LIDOCAINE HCL (PF) 1 % IJ SOLN
INTRAMUSCULAR | Status: AC
  Filled 2020-09-24: qty 30

## 2020-09-24 MED ORDER — FENTANYL CITRATE (PF) 100 MCG/2ML IJ SOLN
INTRAMUSCULAR | Status: DC | PRN
  Administered 2020-09-24: 25 ug via INTRAVENOUS
  Administered 2020-09-24: 50 ug via INTRAVENOUS
  Administered 2020-09-24 (×2): 25 ug via INTRAVENOUS

## 2020-09-24 MED ORDER — SUGAMMADEX SODIUM 200 MG/2ML IV SOLN
INTRAVENOUS | Status: DC | PRN
  Administered 2020-09-24: 300 mg via INTRAVENOUS

## 2020-09-24 MED ORDER — BACITRACIN ZINC 500 UNIT/GM EX OINT
TOPICAL_OINTMENT | CUTANEOUS | Status: AC
  Filled 2020-09-24: qty 28.35

## 2020-09-24 MED ORDER — ACETAMINOPHEN 10 MG/ML IV SOLN
INTRAVENOUS | Status: DC | PRN
  Administered 2020-09-24: 1000 mg via INTRAVENOUS

## 2020-09-24 MED ORDER — DEXAMETHASONE SODIUM PHOSPHATE 10 MG/ML IJ SOLN
INTRAMUSCULAR | Status: DC | PRN
  Administered 2020-09-24: 5 mg via INTRAVENOUS

## 2020-09-24 MED ORDER — MIDAZOLAM HCL 2 MG/2ML IJ SOLN
INTRAMUSCULAR | Status: AC
  Filled 2020-09-24: qty 2

## 2020-09-24 MED ORDER — DEXMEDETOMIDINE HCL 200 MCG/2ML IV SOLN
INTRAVENOUS | Status: DC | PRN
  Administered 2020-09-24 (×2): 4 ug via INTRAVENOUS

## 2020-09-24 MED ORDER — BACITRACIN ZINC 500 UNIT/GM EX OINT
TOPICAL_OINTMENT | CUTANEOUS | Status: DC | PRN
  Administered 2020-09-24: 1 via TOPICAL

## 2020-09-24 MED ORDER — ROCURONIUM BROMIDE 100 MG/10ML IV SOLN
INTRAVENOUS | Status: DC | PRN
  Administered 2020-09-24: 20 mg via INTRAVENOUS
  Administered 2020-09-24: 15 mg via INTRAVENOUS
  Administered 2020-09-24: 5 mg via INTRAVENOUS
  Administered 2020-09-24: 20 mg via INTRAVENOUS
  Administered 2020-09-24: 10 mg via INTRAVENOUS

## 2020-09-24 MED ORDER — CHLORHEXIDINE GLUCONATE 0.12 % MT SOLN
OROMUCOSAL | Status: AC
  Administered 2020-09-24: 15 mL via OROMUCOSAL
  Filled 2020-09-24: qty 15

## 2020-09-24 MED ORDER — FUROSEMIDE 10 MG/ML IJ SOLN
INTRAMUSCULAR | Status: DC | PRN
  Administered 2020-09-24: 10 mg via INTRAMUSCULAR

## 2020-09-24 MED ORDER — FENTANYL CITRATE (PF) 100 MCG/2ML IJ SOLN
INTRAMUSCULAR | Status: AC
  Filled 2020-09-24: qty 2

## 2020-09-24 MED ORDER — PROPOFOL 10 MG/ML IV BOLUS
INTRAVENOUS | Status: DC | PRN
  Administered 2020-09-24: 20 mg via INTRAVENOUS
  Administered 2020-09-24: 150 mg via INTRAVENOUS

## 2020-09-24 MED ORDER — OXYBUTYNIN CHLORIDE 5 MG PO TABS
5.0000 mg | ORAL_TABLET | Freq: Three times a day (TID) | ORAL | 0 refills | Status: DC | PRN
Start: 2020-09-24 — End: 2020-11-07

## 2020-09-24 MED ORDER — LIDOCAINE 1% INJECTION FOR CIRCUMCISION
INJECTION | INTRAVENOUS | Status: DC | PRN
  Administered 2020-09-24: 20 mL via SUBCUTANEOUS

## 2020-09-24 MED ORDER — PHENYLEPHRINE HCL (PRESSORS) 10 MG/ML IV SOLN
INTRAVENOUS | Status: DC | PRN
  Administered 2020-09-24: 100 ug via INTRAVENOUS

## 2020-09-24 MED ORDER — LIDOCAINE HCL (CARDIAC) PF 100 MG/5ML IV SOSY
PREFILLED_SYRINGE | INTRAVENOUS | Status: DC | PRN
  Administered 2020-09-24: 100 mg via INTRAVENOUS

## 2020-09-24 SURGICAL SUPPLY — 63 items
ADAPTER IRRIG TUBE 2 SPIKE SOL (ADAPTER) ×4 IMPLANT
ADPR TBG 2 SPK PMP STRL ASCP (ADAPTER) ×2
APL PRP STRL LF DISP 70% ISPRP (MISCELLANEOUS) ×1
BAG DRN LRG CPC RND TRDRP CNTR (MISCELLANEOUS) ×1
BAG DRN RND TRDRP ANRFLXCHMBR (UROLOGICAL SUPPLIES)
BAG URINE DRAIN 2000ML AR STRL (UROLOGICAL SUPPLIES) IMPLANT
BAG URO DRAIN 4000ML (MISCELLANEOUS) ×2 IMPLANT
BLADE CLIPPER SURG (BLADE) ×2 IMPLANT
BLADE SURG 15 STRL LF DISP TIS (BLADE) ×1 IMPLANT
BLADE SURG 15 STRL SS (BLADE) ×2
BNDG COHESIVE 1X5 TAN NS LF (GAUZE/BANDAGES/DRESSINGS) IMPLANT
BNDG CONFORM 2 STRL LF (GAUZE/BANDAGES/DRESSINGS) ×2 IMPLANT
CANISTER SUCT 1200ML W/VALVE (MISCELLANEOUS) ×2 IMPLANT
CATH FOL 2WAY LX 20X30 (CATHETERS) ×2 IMPLANT
CATH FOL 2WAY LX 22X30 (CATHETERS) IMPLANT
CATH FOLEY 3WAY 30CC 22FR (CATHETERS) IMPLANT
CATH URETL 5X70 OPEN END (CATHETERS) ×2 IMPLANT
CHLORAPREP W/TINT 26 (MISCELLANEOUS) ×2 IMPLANT
CONTAINER COLLECT MORCELLATR (MISCELLANEOUS) ×1 IMPLANT
COVER LIGHT HANDLE STERIS (MISCELLANEOUS) ×2 IMPLANT
COVER WAND RF STERILE (DRAPES) ×2 IMPLANT
CUP MEDICINE 2OZ PLAST GRAD ST (MISCELLANEOUS) ×2 IMPLANT
DRAPE 3/4 80X56 (DRAPES) ×2 IMPLANT
DRAPE LAPAROTOMY 77X122 PED (DRAPES) ×2 IMPLANT
DRAPE UTILITY 15X26 TOWEL STRL (DRAPES) IMPLANT
ELECT CAUTERY BLADE 6.4 (BLADE) ×2 IMPLANT
ELECT REM PT RETURN 9FT ADLT (ELECTROSURGICAL) ×4
ELECTRODE REM PT RTRN 9FT ADLT (ELECTROSURGICAL) ×2 IMPLANT
FIBER LASER FLEXIVA PULSE 550 (Laser) ×4 IMPLANT
FILTER OVERFLOW MORCELLATOR (FILTER) ×1 IMPLANT
GAUZE PETROLATUM 1 X8 (GAUZE/BANDAGES/DRESSINGS) ×2 IMPLANT
GLOVE SURG ENC MOIS LTX SZ6.5 (GLOVE) ×4 IMPLANT
GOWN STRL REUS W/ TWL LRG LVL3 (GOWN DISPOSABLE) ×2 IMPLANT
GOWN STRL REUS W/TWL LRG LVL3 (GOWN DISPOSABLE) ×4
HOLDER FOLEY CATH W/STRAP (MISCELLANEOUS) ×2 IMPLANT
KIT TURNOVER CYSTO (KITS) ×2 IMPLANT
KIT TURNOVER KIT A (KITS) ×2 IMPLANT
LABEL OR SOLS (LABEL) ×2 IMPLANT
MANIFOLD NEPTUNE II (INSTRUMENTS) ×2 IMPLANT
MBRN O SEALING YLW 17 FOR INST (MISCELLANEOUS) ×2
MEMBRANE SLNG YLW 17 FOR INST (MISCELLANEOUS) ×1 IMPLANT
MORCELLATOR COLLECT CONTAINER (MISCELLANEOUS) ×2
MORCELLATOR OVERFLOW FILTER (FILTER) ×2
MORCELLATOR ROTATION 4.75 335 (MISCELLANEOUS) ×4 IMPLANT
NEEDLE HYPO 25X1 1.5 SAFETY (NEEDLE) ×2 IMPLANT
NS IRRIG 500ML POUR BTL (IV SOLUTION) ×2 IMPLANT
PACK BASIN MINOR ARMC (MISCELLANEOUS) ×2 IMPLANT
PACK CYSTO AR (MISCELLANEOUS) ×2 IMPLANT
PENCIL ELECTRO HAND CTR (MISCELLANEOUS) ×2 IMPLANT
SET CYSTO W/LG BORE CLAMP LF (SET/KITS/TRAYS/PACK) IMPLANT
SET IRRIG Y TYPE TUR BLADDER L (SET/KITS/TRAYS/PACK) ×2 IMPLANT
SLEEVE PROTECTION STRL DISP (MISCELLANEOUS) ×4 IMPLANT
SOL .9 NS 3000ML IRR  AL (IV SOLUTION) ×4
SOL .9 NS 3000ML IRR AL (IV SOLUTION) ×4
SOL .9 NS 3000ML IRR UROMATIC (IV SOLUTION) ×4 IMPLANT
SOL PREP PVP 2OZ (MISCELLANEOUS) ×2
SOLUTION PREP PVP 2OZ (MISCELLANEOUS) ×1 IMPLANT
SUT CHROMIC 3 0 SH 27 (SUTURE) ×4 IMPLANT
SYR 10ML LL (SYRINGE) ×2 IMPLANT
SYR TOOMEY IRRIG 70ML (MISCELLANEOUS) ×2
SYRINGE TOOMEY IRRIG 70ML (MISCELLANEOUS) ×1 IMPLANT
TUBE PUMP MORCELLATOR PIRANHA (TUBING) ×2 IMPLANT
WATER STERILE IRR 1000ML POUR (IV SOLUTION) ×2 IMPLANT

## 2020-09-24 NOTE — Anesthesia Preprocedure Evaluation (Signed)
Anesthesia Evaluation  Patient identified by MRN, date of birth, ID band Patient awake    Reviewed: Allergy & Precautions, NPO status , Patient's Chart, lab work & pertinent test results  Airway Mallampati: II  TM Distance: >3 FB     Dental  (+) Poor Dentition, Missing, Chipped   Pulmonary neg pulmonary ROS,    Pulmonary exam normal        Cardiovascular hypertension, Normal cardiovascular exam     Neuro/Psych negative neurological ROS  negative psych ROS   GI/Hepatic negative GI ROS, Neg liver ROS,   Endo/Other  diabetes  Renal/GU negative Renal ROS Bladder dysfunction      Musculoskeletal negative musculoskeletal ROS (+)   Abdominal Normal abdominal exam  (+)   Peds negative pediatric ROS (+)  Hematology negative hematology ROS (+)   Anesthesia Other Findings Past Medical History: No date: Bladder outlet obstruction No date: BPH (benign prostatic hyperplasia) No date: Elevated PSA No date: Feeling of incomplete bladder emptying No date: Hyperlipidemia No date: Hypertension No date: Type 2 diabetes mellitus ([facility]) No date: Weak urinary stream  Reproductive/Obstetrics                             Anesthesia Physical Anesthesia Plan  ASA: II  Anesthesia Plan: General   Post-op Pain Management:    Induction: Intravenous  PONV Risk Score and Plan:   Airway Management Planned: Oral ETT  Additional Equipment:   Intra-op Plan:   Post-operative Plan: Extubation in OR  Informed Consent: I have reviewed the patients History and Physical, chart, labs and discussed the procedure including the risks, benefits and alternatives for the proposed anesthesia with the patient or authorized representative who has indicated his/her understanding and acceptance.     Dental advisory given  Plan Discussed with: CRNA and Surgeon  Anesthesia Plan Comments:         Anesthesia Quick  Evaluation

## 2020-09-24 NOTE — Op Note (Signed)
Date of procedure: 09/24/20  Preoperative diagnosis:  1. BPH with BOO 2. Phimosis  Postoperative diagnosis:  1. same   Procedure: 1. HoLEP with morcellation 2. Circumcision with penile block  Surgeon: Hollice Espy, MD  Anesthesia: General  Complications: None  Intraoperative findings: Obstructing lateral lobes with very small median lobe component, no intravesical prostate.  Hemostasis excellent.  Heavily trabeculated bladder.  EBL: Minimal  Specimens: Prostate chips/foreskin  Drains: 11 French two-way Foley catheter of 50 cc in the balloon  Indication: Bradley Hurst is a 60 y.o. patient with obstructive urinary symptoms who elected holmium laser enucleation.  He also has a history of severe phimosis status post dorsal slit with impaired cosmesis and recurrent inflammation of his foreskin requesting circumcision.  After reviewing the management options for treatment, he elected to proceed with the above surgical procedure(s). We have discussed the potential benefits and risks of the procedure, side effects of the proposed treatment, the likelihood of the patient achieving the goals of the procedure, and any potential problems that might occur during the procedure or recuperation. Informed consent has been obtained.  Description of procedure:  The patient was taken to the operating room and general anesthesia was induced.  The patient was placed in the dorsal lithotomy position, prepped and draped in the usual sterile fashion, and preoperative antibiotics were administered. A preoperative time-out was performed.     A 26 French resectoscope sheath using a blunt angled obturator was introduced without difficulty into the bladder.  The bladder was carefully inspected and noted to be moderately/heavily trabeculated.  There is an minimally elevated bladder neck with a very small intravesical component.  The trigone was able to be visualized with some manipulation and the UOs were good  distance from the bladder neck itself.  The prostatic fossa had significant trilobar coaptation with greater than 5 cm prostatic length.  A 550 m laser fiber was then brought in and using settings of 0.9 J's and 53 Hz, 2 incisions were created at the 5:00 and 7:00 positions of the bladder neck on either side of the median lobe down to the level of the bladder neck/capsular fibers.  The incision was carried down caudally meeting in the midline just above the verumontanum.  The median lobe was then enucleated from a caudal to cranial direction cleaving the adenoma off the underlying capsule rolling it towards the bladder neck and ultimately cleaving the mucosa to free the median lobe into the bladder.   Next, a semilunar incision was created at the prostatic apex on the left side again freeing up the adenoma from the underlying capsule.  Care was taken to avoid any resection past the verumontanum.  This incision was carried around laterally and cranially towards the bladder neck.  Ultimately, I was able to complete the anterior commissure mucosa and the adenoma into the bladder creating a widely patent prostatic fossa.     Next, the same similar incision was created at the right prostatic apex.  Once this was completed and cleared from the bladder neck, the prostatic fossa was noted to be widely patent.  Hemostasis was achieved using hemostatic fiber settings.  Bilateral UOs were visualized and free of any injury.  Finally, the 18 French resectoscope was exchanged for nephroscope and using the Piranha handpiece morcellator, the bladder was distended in each of the prostate chips were evacuated.  The bladder was irrigated several times..  This point time, there were no residual fibers appreciated in the bladder.  Hemostasis was  adequate.  10 mg of IV Lasix was administered to help with postoperative diuresis.  A 20 French two-way Foley catheter was then inserted over a catheter guide with 50 cc in the balloon.   The catheter irrigated easily and well.    At this point in time, the penis was prepped using ChloraPrep solution, additional drapes were applied and surgeon's gloves were changed.  I then performed a dorsal penile block along with a ring block with her total of 20 cc of 1% lidocaine for local anesthetic.  I excised the foreskin by making 2 ring incisions around the penile shaft skin, with the midshaft and one approximate 1 cm proximal to the coronal margin.  The foreskin was then removed in a sleevelike fashion.  Notably, the foreskin and penile shaft skin was extremely thickened consistent with chronic inflammation.  Pinpoint cautery was used to achieve excellent hemostasis.  The frenulum was then reapproximated to the shaft skin along the raphae using a U stitch.  The remainder of the skin was brought together using simple interrupted 3-0 chromic.  Hemostasis was excellent and cosmesis was good.  Patient was then washed and a dressing of Vaseline gauze, Kerlix and Coban was applied.  Additional bacitracin was applied to the meatus and glans.    Patient was then clean and dry, repositioned supine position, reversed from anesthesia, taken to PACU in stable condition.   Plan: Patient will return to the office in 2 for voiding trial and dressing removal.    Hollice Espy, M.D.

## 2020-09-24 NOTE — Transfer of Care (Signed)
Immediate Anesthesia Transfer of Care Note  Patient: Jashaun Penrose  Procedure(s) Performed: HOLEP-LASER ENUCLEATION OF THE PROSTATE WITH MORCELLATION (N/A ) CIRCUMCISION ADULT (N/A )  Patient Location: PACU  Anesthesia Type:General  Level of Consciousness: sedated  Airway & Oxygen Therapy: Patient Spontanous Breathing and Patient connected to face mask oxygen  Post-op Assessment: Report given to RN and Post -op Vital signs reviewed and stable  Post vital signs: Reviewed and stable  Last Vitals:  Vitals Value Taken Time  BP 94/57 09/24/20 1344  Temp 36.8 C 09/24/20 1344  Pulse 67 09/24/20 1344  Resp 15 09/24/20 1344  SpO2 96 % 09/24/20 1344  Vitals shown include unvalidated device data.  Last Pain:  Vitals:   09/24/20 1344  TempSrc:   PainSc: 0-No pain         Complications: No complications documented.

## 2020-09-24 NOTE — Interval H&P Note (Signed)
History and Physical Interval Note:  09/24/2020 10:50 AM  Bradley Hurst  has presented today for surgery, with the diagnosis of benign prostatic hypertrophy with bladder outlet obstruction, phimosis.  The various methods of treatment have been discussed with the patient and family. After consideration of risks, benefits and other options for treatment, the patient has consented to  Procedure(s): [place] MORCELLATION (N/A) CIRCUMCISION ADULT (N/A) as a surgical intervention.  The patient's history has been reviewed, patient examined, no change in status, stable for surgery.  I have reviewed the patient's chart and labs.  Questions were answered to the patient's satisfaction.    RRR CTAB  Hollice Espy

## 2020-09-24 NOTE — Anesthesia Procedure Notes (Signed)
Procedure Name: Intubation Date/Time: 09/24/2020 11:17 AM Performed by: Hedda Slade, CRNA Pre-anesthesia Checklist: Patient identified, Patient being monitored, Timeout performed, Emergency Drugs available and Suction available Patient Re-evaluated:Patient Re-evaluated prior to induction Oxygen Delivery Method: Circle system utilized Preoxygenation: Pre-oxygenation with 100% oxygen Induction Type: IV induction Ventilation: Mask ventilation without difficulty Laryngoscope Size: 4 and McGraph Grade View: Grade I Tube type: Oral Tube size: 7.5 mm Number of attempts: 1 Airway Equipment and Method: Stylet Placement Confirmation: ETT inserted through vocal cords under direct vision,  positive ETCO2 and breath sounds checked- equal and bilateral Secured at: 22 cm Tube secured with: Tape Dental Injury: Teeth and Oropharynx as per pre-operative assessment

## 2020-09-24 NOTE — Discharge Instructions (Signed)
Pfenniger and Fowler's Procedures for Primary Care (3rd ed., pp. [phone]). [place], [place]: [employer]."> Hinman's Atlas of Urological Surgery (4th ed., pp. [phone]). [place]: Elsevier.">  Circumcision, Adult  Circumcision is a surgery to remove the foreskin of the penis or to cut the foreskin so the space between the skin and tip of the penis is larger. When the foreskin is cut but not removed, the procedure is called a dorsal incision or dorsal circumcision. A dorsal circumcision leaves the entire foreskin but makes the end of the foreskin looser so it can be pulled back over the head of the penis. Tell a health care provider about:  Any allergies you have.  All medicines you are taking, including vitamins, herbs, eye drops, creams, and over-the-counter medicines.  Any problems you or family members have had with anesthetic medicines.  Any blood disorders you have.  Any surgeries you have had.  Any medical conditions you have, including the common cold or another infection. What are the risks? Generally, this is a safe procedure. However, problems may occur, including:  Bleeding.  Infection.  Pain.  Allergic reactions to medicines.  Opening of the surgical wound. This can occur from an unwanted erection after surgery. What happens before the procedure? Staying hydrated Follow instructions from your health care provider about hydration, which may include:  Up to 2 hours before the procedure - you may continue to drink clear liquids, such as water, clear fruit juice, black coffee, and plain tea.   Eating and drinking restrictions Follow instructions from your health care provider about eating and drinking, which may include:  8 hours before the procedure - stop eating heavy meals or foods, such as meat, fried foods, or fatty foods.  6 hours before the procedure - stop eating light meals or foods, such as toast or cereal.  6 hours before the procedure - stop drinking milk  or drinks that contain milk.  2 hours before the procedure - stop drinking clear liquids. Medicines  Ask your health care provider about: ? Changing or stopping your regular medicines. This is especially important if you take diabetes medicines or blood thinners. ? Taking medicines such as aspirin and ibuprofen. These medicines can thin your blood. Do not take these medicines unless your health care provider tells you to take them. ? Taking over-the-counter medicines, vitamins, herbs, and supplements. General instructions  Do not use any products that contain nicotine or tobacco for at least 4 weeks before the procedure. These products include cigarettes, e-cigarettes, and chewing tobacco. If you need help quitting, ask your health care provider.  Plan to have someone take you home from the hospital or clinic.  If you will be going home right after the procedure, plan to have someone with you for 24 hours.  Ask your health care provider: ? How your surgery site will be marked. ? What steps will be taken to help prevent infection. These may include:  Washing skin with a germ-killing soap.  Taking antibiotic medicine.   What happens during the procedure?  An IV may be inserted into one of your veins.  You will be given one or more of the following: ? A medicine to help you relax (sedative). ? A medicine to numb the area (local anesthetic). This will be injected with a needle into the skin of your penis.  An incision will be made to remove or cut the foreskin.  Absorbable stitches (sutures) may be used to close the incision.  A bandage (dressing)  will be applied to the incision site.  The IV will be removed. The procedure may vary among health care providers and hospitals. What happens after the procedure?  Your blood pressure, heart rate, breathing rate, and blood oxygen level will be monitored until you leave the hospital or clinic.  Do not get out of bed until your health  care provider approves.  If you were given a sedative during the procedure, it can affect you for several hours. Do not drive or operate machinery until your health care provider says that it is safe. Summary  Circumcision is a surgery to remove the foreskin of the penis or to cut the foreskin so the space between the skin and tip of the penis is larger.  If you will be going home right after the procedure, plan to have someone with you for 24 hours.  Absorbable sutures may be used to close the incision after the foreskin has been removed or cut. This information is not intended to replace advice given to you by your health care provider. Make sure you discuss any questions you have with your health care provider. Document Revised: 05/25/2019 Document Reviewed: 05/25/2019 Elsevier Patient Education  2021 [place], Adult An indwelling urinary catheter is a thin tube that is put into your bladder. The tube helps to drain pee (urine) out of your body. The tube goes in through your urethra. Your urethra is where pee comes out of your body. Your pee will come out through the catheter, then it will go into a bag (drainage bag). Take good care of your catheter so it will work well. How to wear your catheter and bag Supplies needed  Sticky tape (adhesive tape) or a leg strap.  Alcohol wipe or soap and water (if you use tape).  A clean towel (if you use tape).  Large overnight bag.  Smaller bag (leg bag). Wearing your catheter Attach your catheter to your leg with tape or a leg strap.  Make sure the catheter is not pulled tight.  If a leg strap gets wet, take it off and put on a dry strap.  If you use tape to hold the bag on your leg: 1. Use an alcohol wipe or soap and water to wash your skin where the tape made it sticky before. 2. Use a clean towel to pat-dry that skin. 3. Use new tape to make the bag stay on your leg. Wearing your bags You  should have been given a large overnight bag.  You may wear the overnight bag in the day or night.  Always have the overnight bag lower than your bladder.  Do not let the bag touch the floor.  Before you go to sleep, put a clean plastic bag in a wastebasket. Then hang the overnight bag inside the wastebasket. You should also have a smaller leg bag that fits under your clothes.  Always wear the leg bag below your knee.  Do not wear your leg bag at night. How to care for your skin and catheter Supplies needed  A clean washcloth.  Water and mild soap.  A clean towel. Caring for your skin and catheter  Clean the skin around your catheter every day: 1. Wash your hands with soap and water. 2. Wet a clean washcloth in warm water and mild soap. 3. Clean the skin around your urethra.  If you are male:  Gently spread the folds of skin around your vagina (  labia).  With the washcloth in your other hand, wipe the inner side of your labia on each side. Wipe from front to back.  If you are male:  Pull back any skin that covers the end of your penis (foreskin).  With the washcloth in your other hand, wipe your penis in small circles. Start wiping at the tip of your penis, then move away from the catheter.  Move the foreskin back in place, if needed. 4. With your free hand, hold the catheter close to where it goes into your body.  Keep holding the catheter during cleaning so it does not get pulled out. 5. With the washcloth in your other hand, clean the catheter.  Only wipe downward on the catheter.  Do not wipe upward toward your body. Doing this may push germs into your urethra and cause infection. 6. Use a clean towel to pat-dry the catheter and the skin around it. Make sure to wipe off all soap. 7. Wash your hands with soap and water.  Shower every day. Do not take baths.  Do not use cream, ointment, or lotion on the area where the catheter goes into your body, unless your  doctor tells you to.  Do not use powders, sprays, or lotions on your genital area.  Check your skin around the catheter every day for signs of infection. Check for: ? Redness, swelling, or pain. ? Fluid or blood. ? Warmth. ? Pus or a bad smell.      How to empty the bag Supplies needed  Rubbing alcohol.  Gauze pad or cotton ball.  Tape or a leg strap. Emptying the bag Pour the pee out of your bag when it is ?- full, or at least 2-3 times a day. Do this for your overnight bag and your leg bag. 1. Wash your hands with soap and water. 2. Separate (detach) the bag from your leg. 3. Hold the bag over the toilet or a clean pail. Keep the bag lower than your hips and bladder. This is so the pee (urine) does not go back into the tube. 4. Open the pour spout. It is at the bottom of the bag. 5. Empty the pee into the toilet or pail. Do not let the pour spout touch any surface. 6. Put rubbing alcohol on a gauze pad or cotton ball. 7. Use the gauze pad or cotton ball to clean the pour spout. 8. Close the pour spout. 9. Attach the bag to your leg with tape or a leg strap. 10. Wash your hands with soap and water. Follow instructions for cleaning the drainage bag:  From the product maker.  As told by your doctor. How to change the bag Supplies needed  Alcohol wipes.  A clean bag.  Tape or a leg strap. Changing the bag Replace your bag when it starts to leak, smell bad, or look dirty. 1. Wash your hands with soap and water. 2. Separate the dirty bag from your leg. 3. Pinch the catheter with your fingers so that pee does not spill out. 4. Separate the catheter tube from the bag tube where these tubes connect (at the connection valve). Do not let the tubes touch any surface. 5. Clean the end of the catheter tube with an alcohol wipe. Use a different alcohol wipe to clean the end of the bag tube. 6. Connect the catheter tube to the tube of the clean bag. 7. Attach the clean bag to  your leg with tape or a  leg strap. Do not make the bag tight on your leg. 8. Wash your hands with soap and water. General rules  Never pull on your catheter. Never try to take it out. Doing that can hurt you.  Always wash your hands before and after you touch your catheter or bag. Use a mild, fragrance-free soap. If you do not have soap and water, use hand sanitizer.  Always make sure there are no twists or bends (kinks) in the catheter tube.  Always make sure there are no leaks in the catheter or bag.  Drink enough fluid to keep your pee pale yellow.  Do not take baths, swim, or use a hot tub.  If you are male, wipe from front to back after you poop (have a bowel movement).   Contact a doctor if:  Your pee is cloudy.  Your pee smells worse than usual.  Your catheter gets clogged.  Your catheter leaks.  Your bladder feels full. Get help right away if:  You have redness, swelling, or pain where the catheter goes into your body.  You have fluid, blood, pus, or a bad smell coming from the area where the catheter goes into your body.  Your skin feels warm where the catheter goes into your body.  You have a fever.  You have pain in your: ? Belly (abdomen). ? Legs. ? Lower back. ? Bladder.  You see blood in the catheter.  Your pee is pink or red.  You feel sick to your stomach (nauseous).  You throw up (vomit).  You have chills.  Your pee is not draining into the bag.  Your catheter gets pulled out. Summary  An indwelling urinary catheter is a thin tube that is placed into the bladder to help drain pee (urine) out of the body.  The catheter is placed into the part of the body that drains pee from the bladder (urethra).  Taking good care of your catheter will keep it working properly and help prevent problems.  Always wash your hands before and after touching your catheter or bag.  Never pull on your catheter or try to take it out. This information is  not intended to replace advice given to you by your health care provider. Make sure you discuss any questions you have with your health care provider. Document Revised: 11/12/2018 Document Reviewed: 03/06/2017 Elsevier Patient Education  2021 [place]   1) The drugs that you were given will stay in your system until tomorrow so for the next 24 hours you should not:  A) Drive an automobile B) Make any legal decisions C) Drink any alcoholic beverage   2) You may resume regular meals tomorrow.  Today it is better to start with liquids and gradually work up to solid foods.  You may eat anything you prefer, but it is better to start with liquids, then soup and crackers, and gradually work up to solid foods.   3) Please notify your doctor immediately if you have any unusual bleeding, trouble breathing, redness and pain at the surgery site, drainage, fever, or pain not relieved by medication.    4) Additional Instructions:   Please contact your physician with any problems or Same Day Surgery at [phone], Monday through Friday 6 am to 4 pm, or [place] at [facility] number at [phone].

## 2020-09-24 NOTE — Anesthesia Postprocedure Evaluation (Signed)
Anesthesia Post Note  Patient: Bradley Hurst  Procedure(s) Performed: HOLEP-LASER ENUCLEATION OF THE PROSTATE WITH MORCELLATION (N/A ) CIRCUMCISION ADULT (N/A )  Patient location during evaluation: PACU Anesthesia Type: General Level of consciousness: awake and alert and oriented Pain management: pain level controlled Vital Signs Assessment: post-procedure vital signs reviewed and stable Respiratory status: spontaneous breathing Cardiovascular status: blood pressure returned to baseline Anesthetic complications: no   No complications documented.   Last Vitals:  Vitals:   09/24/20 1445 09/24/20 1501  BP: 112/72 108/69  Pulse: 66 64  Resp: 13 13  Temp:  (!) 36.3 C  SpO2: 96% 98%    Last Pain:  Vitals:   09/24/20 1501  TempSrc: Temporal  PainSc: 5                  Alencia Gordon

## 2020-09-25 ENCOUNTER — Encounter: Payer: Self-pay | Admitting: Urology

## 2020-09-25 LAB — SURGICAL PATHOLOGY

## 2020-09-26 ENCOUNTER — Telehealth: Payer: Self-pay | Admitting: *Deleted

## 2020-09-26 ENCOUNTER — Other Ambulatory Visit: Payer: Self-pay

## 2020-09-26 ENCOUNTER — Ambulatory Visit: Payer: 59 | Admitting: Physician Assistant

## 2020-09-26 ENCOUNTER — Ambulatory Visit (INDEPENDENT_AMBULATORY_CARE_PROVIDER_SITE_OTHER): Payer: 59 | Admitting: Physician Assistant

## 2020-09-26 DIAGNOSIS — N471 Phimosis: Secondary | ICD-10-CM

## 2020-09-26 DIAGNOSIS — N401 Enlarged prostate with lower urinary tract symptoms: Secondary | ICD-10-CM

## 2020-09-26 DIAGNOSIS — N138 Other obstructive and reflux uropathy: Secondary | ICD-10-CM

## 2020-09-26 LAB — BLADDER SCAN AMB NON-IMAGING

## 2020-09-26 NOTE — Progress Notes (Signed)
Catheter Removal  Patient is present today for a catheter removal.  64ml of water was drained from the balloon. A 20FR foley cath was removed from the bladder no complications were noted. Patient tolerated well.  Performed by: Debroah Loop, PA-C   Additional notes: Bandage removed s/p circumcision revealing healthy, well healing incision with no evidence of bleeding or infection. Covered the penis circumferentially with loose sterile gauze and counseled patient to wash the incision daily with soap and water and cover the incision with a thin layer of Vaseline and gauze as desired to promote healing. He expressed understanding.  Follow up: Push fluids and RTC this afternoon for PVR.

## 2020-09-26 NOTE — Patient Instructions (Addendum)
Circumcision wound care:  Wash the wound daily with soap and water and pat dry. You may cover the wound with a thin layer of Vaseline and top with gauze to keep it protected as desired. Your stitches will dissolve on their own. If the wound becomes newly swollen, painful, or drains pus, please contact our office for further evaluation.  HOLEP post-op information:  Congratulations on your recent HOLEP procedure! As discussed in clinic today, there are three main side effects that commonly occur after surgery: 1. Burning or pain with urination: This typically resolves within 1 week of surgery. If you are still having significant pain with urination 10 days after surgery, please call our clinic. We may need to check you for a urinary tract infection at that point, though this is rare. 2. Blood in the urine: This may come and go, but typically resolves completely within 3 weeks of surgery. If you are on blood thinners, it may take longer for the bleeding to resolve. As long as your urine remains thin and runny and you are not passing large clots (around the size of your palm), this is a normal postoperative finding. If you start to pass dark red urine or thick, ketchup-like urine, please call our office immediately. 3. Urinary leakage or urgency: This tends to improve with time, with most patients becoming dry within around 3 months of surgery. You may wear absorbant underwear or liners for security during this time. To help you get dry faster, please make sure you are completing your Kegel exercises as instructed, with a set of 10 exercises completed up to three times daily.   Kegel Exercises  Kegel exercises can help strengthen your pelvic floor muscles. The pelvic floor is a group of muscles that support your rectum, small intestine, and bladder. In females, pelvic floor muscles also help support the womb (uterus). These muscles help you control the flow of urine and stool. Kegel exercises are painless  and simple, and they do not require any equipment. Your provider may suggest Kegel exercises to:  Improve bladder and bowel control.  Improve sexual response.  Improve weak pelvic floor muscles after surgery to remove the uterus (hysterectomy) or pregnancy (females).  Improve weak pelvic floor muscles after prostate gland removal or surgery (males). Kegel exercises involve squeezing your pelvic floor muscles, which are the same muscles you squeeze when you try to stop the flow of urine or keep from passing gas. The exercises can be done while sitting, standing, or lying down, but it is best to vary your position. Exercises How to do Kegel exercises: 1. Squeeze your pelvic floor muscles tight. You should feel a tight lift in your rectal area. Keep your stomach, buttocks, and legs relaxed. 2. Hold the muscles tight for up to 10 seconds. 3. Breathe normally. 4. Relax your muscles. 5. Repeat as told by your health care provider. Repeat this exercise daily as told by your health care provider. Continue to do this exercise for at least 4-6 weeks, or for as long as told by your health care provider. You may be referred to a physical therapist who can help you learn more about how to do Kegel exercises. Depending on your condition, your health care provider may recommend:  Varying how long you squeeze your muscles.  Doing several sets of exercises every day.  Doing exercises for several weeks.  Making Kegel exercises a part of your regular exercise routine. This information is not intended to replace advice given to you  by your health care provider. Make sure you discuss any questions you have with your health care provider. Document Revised: 11/25/2019 Document Reviewed: 03/10/2018 Elsevier Patient Education  [employer].

## 2020-09-26 NOTE — Telephone Encounter (Addendum)
Attempted to leave VM-unable to reach patient or VM.  ----- Message from Hollice Espy, MD sent at 09/25/2020  2:43 PM EST ----- Please let this patient know that his surgical pathology was negative, no evidence of prostate cancer in the specimen.  Hollice Espy, MD

## 2020-09-26 NOTE — Progress Notes (Signed)
Afternoon follow-up  Patient returned to clinic this afternoon for repeat PVR. He reports drinking water. He has had a significant amount of urinary leakage with standing and has only made it to the restroom once. He has had urinary leakage. PVR 53mL.  Results for orders placed or performed in visit on 09/26/20  BLADDER SCAN AMB NON-IMAGING  Result Value Ref Range   Scan Result 63mL    Voiding trial passed. Counseled patient on normal postoperative findings including dysuria, gross hematuria, and urinary leakage.  Counseled him to start Kegel exercises 3x10 sets daily and provided written instructions.  Counseled him to resume his normal fluid intake.  Shared negative pathology results, patient expressed understanding.  Follow up: Return in about 6 weeks (around 11/07/2020) for Postop f/u with IPSS and PVR.

## 2020-11-07 ENCOUNTER — Encounter: Payer: Self-pay | Admitting: Urology

## 2020-11-07 ENCOUNTER — Ambulatory Visit (INDEPENDENT_AMBULATORY_CARE_PROVIDER_SITE_OTHER): Payer: 59 | Admitting: Urology

## 2020-11-07 ENCOUNTER — Other Ambulatory Visit: Payer: Self-pay

## 2020-11-07 VITALS — BP 129/83 | HR 93 | Ht 70.0 in | Wt 247.0 lb

## 2020-11-07 DIAGNOSIS — N401 Enlarged prostate with lower urinary tract symptoms: Secondary | ICD-10-CM | POA: Diagnosis not present

## 2020-11-07 DIAGNOSIS — N3941 Urge incontinence: Secondary | ICD-10-CM

## 2020-11-07 DIAGNOSIS — N138 Other obstructive and reflux uropathy: Secondary | ICD-10-CM | POA: Diagnosis not present

## 2020-11-07 LAB — BLADDER SCAN AMB NON-IMAGING

## 2020-11-07 NOTE — Progress Notes (Signed)
11/07/2020  4:02 PM   Bradley Hurst Oct 02, 1960 [id number]  Referring provider: [name], MD [address] [place],  [place] [address] Chief Complaint  Patient presents with  . Post-op Follow-up    6wk w/IPSS & PVR    HPI: Bradley Hurst is a 60 y.o. male who presents today for a 6 week post-op follow up after holep and circumcision.  Surgical pathology: Benign prostate tissue, which was 28.75 grams.   Today the patient says that he said that he's been having enough leakage to fill a male pad (2-3 per day) when he sits down after voiding. Symptoms have worsened since activity increased.   Improved stream/ bladder emtpying.    Overall pleased.    No dysuria / gross hematuria.       IPSS    Row Name 11/07/20 1500         International Prostate Symptom Score   How often have you had to urinate less than every two hours? Less than 1 in 5 times     How often have you found you stopped and started again several times when you urinated? Not at All     How often have you found it difficult to postpone urination? Almost always     How often have you had a weak urinary stream? Not at All     How often have you had to strain to start urination? Not at All     How many times did you typically get up at night to urinate? 2 Times     Total IPSS Score 8           Quality of Life due to urinary symptoms   If you were to spend the rest of your life with your urinary condition just the way it is now how would you feel about that? Mostly Satisfied            Score:  1-7 Mild 8-19 Moderate 20-35 Severe    PMH: Past Medical History:  Diagnosis Date  . Bladder outlet obstruction   . BPH (benign prostatic hyperplasia)   . Elevated PSA   . Feeling of incomplete bladder emptying   . Hyperlipidemia   . Hypertension   . Type 2 diabetes mellitus ([place])   . Weak urinary stream     Surgical History: Past Surgical History:  Procedure Laterality Date  . CIRCUMCISION  2017  .  CIRCUMCISION N/A 09/24/2020   Procedure: CIRCUMCISION ADULT;  Surgeon: Hollice Espy, MD;  Location: [facility];  Service: Urology;  Laterality: N/A;  . COLONOSCOPY N/A 04/23/2018   Dr. Gala Romney: 45mm hyperplasitc sigmoid polyp removed. next colonoscopy in 10 years  . FLEXIBLE SIGMOIDOSCOPY N/A 07/19/2019   Procedure: FLEXIBLE SIGMOIDOSCOPY;  Surgeon: Daneil Dolin, MD;  Location: [facility];  Service: Endoscopy;  Laterality: N/A;  8:30am  . HOLEP-LASER ENUCLEATION OF THE PROSTATE WITH MORCELLATION N/A 09/24/2020   Procedure: HOLEP-LASER ENUCLEATION OF THE PROSTATE WITH MORCELLATION;  Surgeon: Hollice Espy, MD;  Location: [facility] OR[facility];  Service: Urology;  Laterality: N/A;  . POLYPECTOMY  04/23/2018   Procedure: POLYPECTOMY;  Surgeon: Daneil Dolin, MD;  Location: [facility];  Service: Endoscopy;;    Home Medications:  Allergies as of 11/07/2020   No Known Allergies     Medication List       Accurate as of November 07, 2020  4:02 PM. If you have any questions, ask your nurse or doctor.  STOP taking these medications   finasteride 5 MG tablet Commonly known as: PROSCAR Stopped by: Hollice Espy, MD   HYDROcodone-acetaminophen 5-325 MG tablet Commonly known as: NORCO/VICODIN Stopped by: Hollice Espy, MD   oxybutynin 5 MG tablet Commonly known as: DITROPAN Stopped by: Hollice Espy, MD     TAKE these medications   acarbose 100 MG tablet Commonly known as: PRECOSE Take 100 mg by mouth 3 (three) times daily.   amLODipine 10 MG tablet Commonly known as: NORVASC Take 10 mg by mouth at bedtime.   calcium carbonate 500 MG chewable tablet Commonly known as: TUMS - dosed in mg elemental calcium Chew 1 tablet by mouth daily as needed for indigestion or heartburn.   empagliflozin 25 MG Tabs tablet Commonly known as: JARDIANCE Take 25 mg by mouth daily.   ezetimibe 10 MG tablet Commonly known as: ZETIA Take 10 mg by mouth at bedtime.   hydrochlorothiazide 12.5 MG  capsule Commonly known as: MICROZIDE Take 12.5 mg by mouth daily.   losartan 100 MG tablet Commonly known as: COZAAR Take 100 mg by mouth daily.   metFORMIN 1000 MG tablet Commonly known as: GLUCOPHAGE Take 1,000 mg by mouth 2 (two) times daily with a meal.   metoprolol tartrate 25 MG tablet Commonly known as: LOPRESSOR Take 25 mg by mouth at bedtime.   Potassium Chloride CR 8 MEQ Cpcr capsule CR Commonly known as: MICRO-K Take 8 mEq by mouth at bedtime.   pravastatin 40 MG tablet Commonly known as: PRAVACHOL Take 40 mg by mouth daily.   tadalafil 5 MG tablet Commonly known as: CIALIS Take 5 mg by mouth daily.   topiramate 50 MG tablet Commonly known as: TOPAMAX Take 50 mg by mouth 2 (two) times daily.   Trulicity 7.41 OI/7.8MV Sopn Generic drug: Dulaglutide Inject 0.75 mg into the skin every Friday.       Allergies: No Known Allergies  Family History: Family History  Problem Relation Age of Onset  . Diabetes Mother   . Heart attack Father   . Ovarian cancer Sister     Social History:   reports that he has never smoked. He has never used smokeless tobacco. He reports that he does not drink alcohol and does not use drugs.   Physical Exam: BP 129/83   Pulse 93   Ht 5\' 10"  (1.778 m)   Wt 247 lb (112 kg)   BMI 35.44 kg/m   Constitutional:  Alert and oriented, No acute distress. HEENT: Star AT, moist mucus membranes.  Trachea midline, no masses. Cardiovascular: No clubbing, cyanosis, or edema. Respiratory: Normal respiratory effort, no increased work of breathing. GU: Penis is healing well, penile shaft skin is mildly thickened but is overall good cosmesis. Skin: No rashes, bruises or suspicious lesions. Neurologic: Grossly intact, no focal deficits, moving all 4 extremities. Psychiatric: Normal mood and affect.   Pertinent Imaging:  Results for orders placed or performed in visit on 11/07/20  BLADDER SCAN AMB NON-IMAGING  Result Value Ref Range    Scan Result 90ml     Assessment & Plan:    1. BPH with urinary obstruction Dramatic improvement in urinary symptoms. Discontinue finasteride.  2. Stress urinary incontinence Fairly significant but improving Referral to physical therapy  Follow up in 6 months with IPSS and PVR. Patient was reassured and it should improve with time. Discussed pelvic floor strengthening exercises.  3. Phimosis Circumcision healing well.  Follow Up:  Referring to physical therapist in [place].  Return in  about 6 months (around 05/09/2021) for 41mo w/IPSS &PVR.   Jaclyn Shaggy, am acting as a scribe for Dr. Hollice Espy.  I have reviewed the above documentation for accuracy and completeness, and I agree with the above.   Hollice Espy, MD      [facility] [address], [place], [place] [address] [phone]

## 2021-01-08 ENCOUNTER — Other Ambulatory Visit: Payer: Self-pay | Admitting: Urology

## 2021-01-14 ENCOUNTER — Ambulatory Visit: Payer: 59 | Attending: Urology | Admitting: Physical Therapy

## 2021-01-14 ENCOUNTER — Encounter: Payer: Self-pay | Admitting: Physical Therapy

## 2021-01-14 ENCOUNTER — Other Ambulatory Visit: Payer: Self-pay

## 2021-01-14 DIAGNOSIS — M6281 Muscle weakness (generalized): Secondary | ICD-10-CM | POA: Diagnosis not present

## 2021-01-14 DIAGNOSIS — R278 Other lack of coordination: Secondary | ICD-10-CM

## 2021-01-14 NOTE — Therapy (Signed)
[facility] [address]. [place], [place], [address] Phone: [phone]   Fax:  [phone]  Physical Therapy Evaluation  Patient Details  Name: Bradley Hurst MRN: [id number] Date of Birth: [birth year] Referring Provider (PT): Hollice Espy   Encounter Date: 01/14/2021   PT End of Session - 01/14/21 1134     Visit Number 1    Number of Visits 12    Date for PT Re-Evaluation 04/08/21    Authorization Type IE 01/14/2021    PT Start Time 1140    PT Stop Time 1225    PT Time Calculation (min) 45 min    Activity Tolerance Patient tolerated treatment well    Behavior During Therapy [facility] for tasks assessed/performed             Past Medical History:  Diagnosis Date   Bladder outlet obstruction    BPH (benign prostatic hyperplasia)    Elevated PSA    Feeling of incomplete bladder emptying    Hyperlipidemia    Hypertension    Type 2 diabetes mellitus ([place])    Weak urinary stream     Past Surgical History:  Procedure Laterality Date   CIRCUMCISION  2017   CIRCUMCISION N/A 09/24/2020   Procedure: CIRCUMCISION ADULT;  Surgeon: Hollice Espy, MD;  Location: [facility] ORS;  Service: Urology;  Laterality: N/A;   COLONOSCOPY N/A 04/23/2018   Dr. Gala Romney: 23mm hyperplasitc sigmoid polyp removed. next colonoscopy in 10 years   FLEXIBLE SIGMOIDOSCOPY N/A 07/19/2019   Procedure: FLEXIBLE SIGMOIDOSCOPY;  Surgeon: Daneil Dolin, MD;  Location: [facility];  Service: Endoscopy;  Laterality: N/A;  8:30am   HOLEP-LASER ENUCLEATION OF THE PROSTATE WITH MORCELLATION N/A 09/24/2020   Procedure: HOLEP-LASER ENUCLEATION OF THE PROSTATE WITH MORCELLATION;  Surgeon: Hollice Espy, MD;  Location: [facility] ORS;  Service: Urology;  Laterality: N/A;   POLYPECTOMY  04/23/2018   Procedure: POLYPECTOMY;  Surgeon: Daneil Dolin, MD;  Location: [facility];  Service: Endoscopy;;    There were no vitals filed for this visit.        [facility] PT Assessment -  01/14/21 0001       Assessment   Medical Diagnosis UI    Referring Provider (PT) Hollice Espy    Onset Date/Surgical Date 09/24/20    Hand Dominance Left    Next MD Visit 05/08/2021      Balance Screen   Has the patient fallen in the past 6 months No             PELVIC HEALTH PHYSICAL THERAPY EVALUATION  SCREENING Red Flags: None Have you had any night sweats? Unexplained weight loss? Saddle anesthesia? Unexplained changes in bowel or bladder habits?  Precautions: None noted.   SUBJECTIVE  Chief Complaint: Patient notes that in the last week or so he is having difficulty controlling bowels. Patient had HOLEP procedure in 09/2020 and since then has had limited sensation/urge. Patient notes that when he performs STS he has UI. He works outside and notes considerable water consumption (up to 3 gal/day). Patient notes that he empties but at the end of emptying, there is some left behind that he has to wait for a second void. Patient continues to have pain that he is unsure how to explain; he compares it to the sensation of holding urine too long; patient notes he feels this inside the bladder.   Patient notes he has leakage in the evenings when he has been working outside all  day. Patient has little to no leakage sleeping at night. Patient does note less leakage in the morning, but it is present. Patient denies leakage with coughing, laughing, sneezing.   Pertinent History:  Falls Negative. Scoliosis Negative. Pulmonary disease/dysfunction Negative.  Surgical history: Positive for see above.   Work History:   Occupation: works outside; Designer, television/film set; cutting down trees; building   Urological History: Prostatectomy: No Date:   HOLEP: Positive; 09/24/2020 TURP: Negative  PSA: 0.2 ng/mL; last measured 09/11/2020 PVR: 28 mL (11/07/2020)  Urinary History: Incontinence: Positive. Onset: 09/2020 Triggers: lifting, fatigue, standing.  Amount: Min/Mod/Complete Loss. Protective  undergarments: Yes Type: incontinence Number used/day: 1-2 Fluid Intake: ~1-3 gal H20, 5-6 cups tea caffeinated, 3-5 diet sodas. Nocturia: 1x/night Frequency of urination: every 1 hours Toileting posture: standing Pain with urination: Positive for tingling.   Difficulty initiating urination: Negative  Intermittent stream: Negative  Post-micturition dribble: Positive  Frequent UTI: Negative.  Gastrointestinal History: [place] Stool Chart: Type 2 transition to 7 Frequency of BMs: 1x/day baseline; at this time variable 2x/day to every 3rd day Pain with defecation: Negative Straining with defecation: Negative Hemorrhoids: Positive ; external; active  Toileting posture: feet flat Incontinence: Positive. Onset: 01/2021. Triggers: urgency. Amount: Complete Loss. Patient notes this has only happened twice at the beginning of June and has not happened since, but does note concern over continued presence of urgency.  Location of pain: bladder Current pain: 0/10 Max pain: 1/10 when emptying Least pain: 0/10 Pain quality: pain quality: tingling  Radiating pain: No  Current activities: clearing land and putting in a swimming pool  Patient Goals: "To where I don't have this leakage and the bladder is back to normal. I guess the control of the bowels."   OBJECTIVE  Mental Status Patient is oriented to person, place and time. Recent memory is intact. Remote memory is intact. Attention span and concentration are intact. Expressive speech is intact. Patient's fund of knowledge is within normal limits for educational level.  POSTURE/OBSERVATIONS:  Lumbar lordosis: diminished significantly; posterior pelvic tilt in both standing and sitting as well as static and dynamic postures. Thoracic kyphosis: WNL Iliac crest height: appearing equal bilaterally  Lumbar lateral shift: negative  Pelvic obliquity: appearing negative Leg length discrepancy: appearing negative  GAIT: Patient ambulates  with little to no pelvic rotation and stiff spinal position over all. Increased supination bilaterally limited stride length.  Trendelenburg R: Negative L: Negative   RANGE OF MOTION: deferred 2/2 to time constraints    LEFT RIGHT  Lumbar forward flexion (65):      Lumbar extension (30):     Lumbar lateral flexion (25):     Thoracic and Lumbar rotation (30 degrees):       Hip Flexion (0-125):      Hip IR (0-45):     Hip ER (0-45):     Hip Abduction (0-40):     Hip extension (0-15):       STRENGTH: MMT deferred 2/2 to time constraints   RLE LLE  Hip Flexion    Hip Extension    Hip Abduction     Hip Adduction     Hip ER     Hip IR     Knee Extension    Knee Flexion    Dorsiflexion     Plantarflexion (seated)     ABDOMINAL: deferred 2/2 to time constraints  Palpation: Suprapubic percussion:  Diastasis: Scar mobility:  Rib flare:  SPECIAL TESTS: deferred 2/2 to time constraints   PHYSICAL PERFORMANCE  MEASURES:  STS: WNL; positive for UI  EXTERNAL PELVIC EXAM: deferred 2/2 to time constraints  Palpation: Breath coordination: Perineal Mobility Testing: Voluntary Contraction: present/absent Relaxation: present/delayed/ non-relaxing Perineal movement with sustained increase in IAP ("bear down"):  Perineal movement with rapid increase in IAP ("cough"):  DIGITAL RECTAL EXAM: deferred 2/2 to patient comfort Strength (PERF): Symmetry: Palpation: Prolapse:  OUTCOME MEASURES: IPSS (Mild-6; QoL 5 Unhappy) International Prostate Symptom Score    How often have you had the sensation of not emptying your bladder? Less than 1 in 5 times      How often have you had to urinate less than every two hours? Less than half the time        How often have you found you stopped and started again several times when you urinated? Not at All        How often have you found it difficult to postpone urination? Less than half the time        How often have you had a weak urinary stream?  Not at All        How often have you had to strain to start urination? Not at All        How many times did you typically get up at night to urinate? 1 Time        Total IPSS Score 6      ASSESSMENT Patient is a 60 year old presenting to clinic with chief complaints of urinary incontinence and fecal urgency s/p HOLEP on 09/24/2020. Today's evaluation is suggestive of deficits in posture, intra-abdominal pressure management (IAP), PFM coordination, PFM endurance, and PFM strength as evidenced by UI with transfers, lifting, fatigue, and urgency, presence of post-micturition dribble, use of 1-2 incontinence pads/day, and posteriorly tilted pelvis with hypomobility observed during gait cycle. Patient's responses on IPSS outcome measures (6) indicate mild limitations; however patient's QoL rating on IPSS 5 (unhappy) indicates continued distress from symptoms. Considering onset of fecal incontinence in the presence of Type 2 DM, it is possible that the two incidents were correlated with hyperglycemia as well as PFM deficits. It is worth noting that patient's stool type according to the [facility] Stool Chart has been Type 7 (diarrhea) over the past 2-4 weeks and consumption of liquids is >2 gallons/day both of which suggest possible need for adjusted management of DM diagnosis. Patient's progress may be limited due to ongoing management of Type 2 DM; however, patient's motivation and current progression since surgical intervention is advantageous. Patient was able to achieve basic understanding of bulbar urethral massage for decreased post-micturition dribble during today's evaluation and responded positively to educational interventions. Patient will benefit from continued skilled therapeutic intervention to address deficits in posture, IAP management, PFM coordination, PFM endurance, and PFM strength in order to increase function and improve overall QOL.  EDUCATION Patient educated on prognosis, POC, and provided  with HEP including: bulbar urethral massage; exhale with transfers. Patient articulated understanding and returned demonstration. Patient will benefit from further education in order to maximize compliance and understanding for long-term therapeutic gains.  TREATMENT  Neuromuscular Re-education: Patient educated on primary functions of the pelvic floor including: posture/balance, sexual pleasure, storage and elimination of waste from the body, abdominal cavity closure, and breath coordination. Patient educated on "milking"/bulbar urethral massage for decreased post-void dribble and to restore typical coordination of micturition. Provided with handout (verbal and visual descriptions).       Objective measurements completed on examination: See above findings.  PT Long Term Goals - 01/14/21 1436       PT LONG TERM GOAL #1   Title Patient will demonstrate independence with HEP in order to maximize therapeutic gains and improve carryover from physical therapy sessions to ADLs in the home and community.    Baseline IE: not initiated    Time 12    Period Weeks    Status New    Target Date 04/08/21      PT LONG TERM GOAL #2   Title Patient will report no difficulty with postponing urination as indicated on IPSS outcome measure by a rating of "not at all" and overall IPSS score of 3 or less for improved function and participation.    Baseline IE: "less than half the time", 6 (mild)    Time 12    Period Weeks    Status New    Target Date 04/08/21      PT LONG TERM GOAL #3   Title Patient will demonstrate circumferential and sequential contraction of >4/5 MMT, > 6 sec hold x10 and 5 consecutive quick flicks with </= 10 min rest between testing bouts, and relaxation of the PFM coordinated with breath for improved management of intra-abdominal pressure and normal bowel and bladder function without the presence of pain nor incontinence in order to improve  participation at home and in the community.    Baseline IE: assessed at next visit    Time 12    Period Weeks    Status New    Target Date 04/08/21      PT LONG TERM GOAL #4   Title Patient will report decreased reliance on protective undergarments as indicated by < 1x/24 hour period to demonstrate improved bladder control and allow for increased participation in activities outside of the home.    Baseline IE: 1-2x/day    Time 12    Period Weeks    Status New    Target Date 04/08/21      PT LONG TERM GOAL #5   Title Patient will report BMs classified as Type 3-Type 4 on the [facility] Stool Chart greater than 50% of the time to demonstrate improved motility and stool bulking in order to decrease fecal distress and improve overall QOL.    Baseline IE: Type 2/7    Time 12    Period Weeks    Status New    Target Date 04/08/21                    Plan - 01/14/21 1134     Clinical Impression Statement Patient is a 60 year old presenting to clinic with chief complaints of urinary incontinence and fecal urgency s/p HOLEP on 09/24/2020. Today's evaluation is suggestive of deficits in posture, intra-abdominal pressure management (IAP), PFM coordination, PFM endurance, and PFM strength as evidenced by UI with transfers, lifting, fatigue, and urgency, presence of post-micturition dribble, use of 1-2 incontinence pads/day, and posteriorly tilted pelvis with hypomobility observed during gait cycle. Patient's responses on IPSS outcome measures (6) indicate mild limitations; however patient's QoL rating on IPSS 5 (unhappy) indicates continued distress from symptoms. Considering onset of fecal incontinence in the presence of Type 2 DM, it is possible that the two incidents were correlated with hyperglycemia as well as PFM deficits. It is worth noting that patient's stool type according to the [facility] Stool Chart has been Type 7 (diarrhea) over the past 2-4 weeks and consumption of liquids is >2  gallons/day both of which  suggest possible need for adjusted management of DM diagnosis. Patient's progress may be limited due to ongoing management of Type 2 DM; however, patient's motivation and current progression since surgical intervention is advantageous. Patient was able to achieve basic understanding of bulbar urethral massage for decreased post-micturition dribble during today's evaluation and responded positively to educational interventions. Patient will benefit from continued skilled therapeutic intervention to address deficits in posture, IAP management, PFM coordination, PFM endurance, and PFM strength in order to increase function and improve overall QOL.    Personal Factors and Comorbidities Age;Behavior Pattern;Comorbidity 3+;Past/Current Experience    Comorbidities HTN, hyperlipidemia, elevated PSA, DM, BPH, BOO    Examination-Activity Limitations Continence;Stand;Lift;Transfers    Examination-Participation Restrictions Yard Work;Community Activity    Stability/Clinical Decision Making Evolving/Moderate complexity    Clinical Decision Making Moderate    Rehab Potential Excellent    PT Frequency 1x / week    PT Duration 12 weeks    PT Treatment/Interventions ADLs/Self Care Home Management;Cryotherapy;Electrical Stimulation;Moist Heat;Therapeutic exercise;Neuromuscular re-education;Patient/family education;Manual techniques;Scar mobilization;Spinal Manipulations;Joint Manipulations;Taping;Therapeutic activities    PT Next Visit Plan physical assessment    PT Home Exercise Plan Implement exhale with transfers; try bulbar urethral massage for decreased PVD    Consulted and Agree with Plan of Care Patient             Patient will benefit from skilled therapeutic intervention in order to improve the following deficits and impairments:  Decreased activity tolerance, Decreased coordination, Decreased endurance, Decreased strength, Improper body mechanics, Postural dysfunction  Visit  Diagnosis: Muscle weakness (generalized)  Other lack of coordination     Problem List Patient Active Problem List   Diagnosis Date Noted   Abnormal CT scan 05/04/2019   Rectal abnormality 05/04/2019   Elevated LFTs    Fever 03/16/2016   Sepsis ([place]) 03/16/2016   Dehydration 03/16/2016   Leukopenia 03/16/2016   Thrombocytopenia ([place]) 03/16/2016   Essential hypertension 03/16/2016   Diabetes mellitus type II, non insulin dependent ([place]) 03/16/2016    Myles Gip PT, DPT [phone]  01/14/2021, 2:38 PM  [facility] [address]. [place], [place], [address] Phone: [phone]   Fax:  [phone]  Name: Bradley Hurst MRN: [id number] Date of Birth: 05-08-1961

## 2021-01-21 ENCOUNTER — Ambulatory Visit: Payer: 59 | Admitting: Physical Therapy

## 2021-01-22 ENCOUNTER — Ambulatory Visit: Payer: 59 | Admitting: Physical Therapy

## 2021-01-22 ENCOUNTER — Encounter: Payer: Self-pay | Admitting: Physical Therapy

## 2021-01-22 ENCOUNTER — Other Ambulatory Visit: Payer: Self-pay

## 2021-01-22 DIAGNOSIS — R278 Other lack of coordination: Secondary | ICD-10-CM

## 2021-01-22 DIAGNOSIS — M6281 Muscle weakness (generalized): Secondary | ICD-10-CM | POA: Diagnosis not present

## 2021-01-22 NOTE — Therapy (Signed)
[facility] [address] [place], [place], [address] Phone: [phone]   Fax:  [phone]  Physical Therapy Treatment  Patient Details  Name: Bradley Hurst MRN: [id number] Date of Birth: 11/22/1960 Referring Provider (PT): Hollice Espy   Encounter Date: 01/22/2021   PT End of Session - 01/22/21 1200     Visit Number 2    Number of Visits 12    Date for PT Re-Evaluation 04/08/21    Authorization Type IE 01/14/2021    PT Start Time 1147    PT Stop Time 1244    PT Time Calculation (min) 57 min    Activity Tolerance Patient tolerated treatment well    Behavior During Therapy [facility] for tasks assessed/performed             Past Medical History:  Diagnosis Date   Bladder outlet obstruction    BPH (benign prostatic hyperplasia)    Elevated PSA    Feeling of incomplete bladder emptying    Hyperlipidemia    Hypertension    Type 2 diabetes mellitus ([place])    Weak urinary stream     Past Surgical History:  Procedure Laterality Date   CIRCUMCISION  2017   CIRCUMCISION N/A 09/24/2020   Procedure: CIRCUMCISION ADULT;  Surgeon: Hollice Espy, MD;  Location: [facility] ORS;  Service: Urology;  Laterality: N/A;   COLONOSCOPY N/A 04/23/2018   Dr. Gala Romney: 18mm hyperplasitc sigmoid polyp removed. next colonoscopy in 10 years   FLEXIBLE SIGMOIDOSCOPY N/A 07/19/2019   Procedure: FLEXIBLE SIGMOIDOSCOPY;  Surgeon: Daneil Dolin, MD;  [facility];  Service: Endoscopy;  Laterality: N/A;  8:30am   HOLEP-LASER ENUCLEATION OF THE PROSTATE WITH MORCELLATION N/A 09/24/2020   Procedure: HOLEP-LASER ENUCLEATION OF THE PROSTATE WITH MORCELLATION;  Surgeon: Hollice Espy, MD;  Location: [facility] ORS;  Service: Urology;  Laterality: N/A;   POLYPECTOMY  04/23/2018   Procedure: POLYPECTOMY;  Surgeon: Daneil Dolin, MD;  [facility]: [facility];  Service: Endoscopy;;    There were no vitals filed for this visit.   Subjective Assessment - 01/22/21  1157     Subjective Patient notes no changes since evaluation of PFM. Patient's MD took him off the water pills which has helped with some bladder control issues. Patient notes this is a three month trial and will revisit with MD.    Currently in Pain? No/denies             TREATMENT  Pre-treatment assessment: RANGE OF MOTION:    LEFT RIGHT  Lumbar forward flexion (65):  WNL    Lumbar extension (30): WNL    Lumbar lateral flexion (25):  WNL WNL  Thoracic and Lumbar rotation (30 degrees):    WNL WNL  Hip Flexion (0-125):   WNL WNL  Hip IR (0-45):  10  15  Hip ER (0-45):  WNL WNL  Hip Abduction (0-40):  WNL WNL  Hip extension (0-15):  WNL WNL   L slightly depressed compared to R IC; R anterior pelvic obliquity.  STRENGTH: MMT   RLE LLE  Hip Flexion 5 5  Hip Extension 5 5  Hip Abduction  5 5  Hip Adduction  5 5  Hip ER  5 5  Hip IR  5 5  Knee Extension 5 5  Knee Flexion 5 5  Dorsiflexion  5 5  Plantarflexion (seated) 5 5   ABDOMINAL:  Palpation: no TTP Diastasis: 4 finger width Scar mobility: Rib flare: R > L,  present B  EXTERNAL PELVIC EXAM: Patient educated on the purpose of the pelvic exam and articulated understanding; patient consented to the exam verbally. Breath coordination: present Cued Lengthen: present Cued Contraction: present, x5 reps Cough: paradoxical  Neuromuscular Re-education: Supine hooklying diaphragmatic breathing with VCs and TCs for downregulation of the nervous system and improved management of IAP Supine hooklying, PFM lengthening with inhalation. VCs and TCs to decrease compensatory patterns and encourage optimal relaxation of the PFM. Supine hooklying, PFM contractions with exhalation. VCs and TCs to decrease compensatory patterns and encourage activation of the PFM.    Patient educated throughout session on appropriate technique and form using multi-modal cueing, HEP, and activity modification. Patient articulated understanding and  returned demonstration.  Patient Response to interventions: Comfortable to work on HEP for 2 weeks.  ASSESSMENT Patient presents to clinic with excellent motivation to participate in therapy. Patient demonstrates deficits in posture, intra-abdominal pressure management (IAP), PFM coordination, PFM endurance, and PFM strength. Patient able to achieve 5 consecutive PFM contractions with acceptable quality during today's session and responded positively to educational interventions. Patient will benefit from continued skilled therapeutic intervention to address remaining deficits in posture, intra-abdominal pressure management (IAP), PFM coordination, PFM endurance, and PFM strength in order to increase function and improve overall QOL.     PT Long Term Goals - 01/14/21 1436       PT LONG TERM GOAL #1   Title Patient will demonstrate independence with HEP in order to maximize therapeutic gains and improve carryover from physical therapy sessions to ADLs in the home and community.    Baseline IE: not initiated    Time 12    Period Weeks    Status New    Target Date 04/08/21      PT LONG TERM GOAL #2   Title Patient will report no difficulty with postponing urination as indicated on IPSS outcome measure by a rating of "not at all" and overall IPSS score of 3 or less for improved function and participation.    Baseline IE: "less than half the time", 6 (mild)    Time 12    Period Weeks    Status New    Target Date 04/08/21      PT LONG TERM GOAL #3   Title Patient will demonstrate circumferential and sequential contraction of >4/5 MMT, > 6 sec hold x10 and 5 consecutive quick flicks with </= 10 min rest between testing bouts, and relaxation of the PFM coordinated with breath for improved management of intra-abdominal pressure and normal bowel and bladder function without the presence of pain nor incontinence in order to improve participation at home and in the community.    Baseline IE:  assessed at next visit    Time 12    Period Weeks    Status New    Target Date 04/08/21      PT LONG TERM GOAL #4   Title Patient will report decreased reliance on protective undergarments as indicated by < 1x/24 hour period to demonstrate improved bladder control and allow for increased participation in activities outside of the home.    Baseline IE: 1-2x/day    Time 12    Period Weeks    Status New    Target Date 04/08/21      PT LONG TERM GOAL #5   Title Patient will report BMs classified as Type 3-Type 4 on the [facility] Stool Chart greater than 50% of the time to demonstrate improved motility and stool bulking  in order to decrease fecal distress and improve overall QOL.    Baseline IE: Type 2/7    Time 12    Period Weeks    Status New    Target Date 04/08/21                   Plan - 01/22/21 1203     Clinical Impression Statement Patient presents to clinic with excellent motivation to participate in therapy. Patient demonstrates deficits in posture, intra-abdominal pressure management (IAP), PFM coordination, PFM endurance, and PFM strength. Patient able to achieve 5 consecutive PFM contractions with acceptable quality during today's session and responded positively to educational interventions. Patient will benefit from continued skilled therapeutic intervention to address remaining deficits in posture, intra-abdominal pressure management (IAP), PFM coordination, PFM endurance, and PFM strength in order to increase function and improve overall QOL.    Personal Factors and Comorbidities Age;Behavior Pattern;Comorbidity 3+;Past/Current Experience    Comorbidities HTN, hyperlipidemia, elevated PSA, DM, BPH, BOO    Examination-Activity Limitations Continence;Stand;Lift;Transfers    Examination-Participation Restrictions Yard Work;Community Activity    Stability/Clinical Decision Making Evolving/Moderate complexity    Rehab Potential Excellent    PT Frequency 1x / week    PT  Duration 12 weeks    PT Treatment/Interventions ADLs/Self Care Home Management;Cryotherapy;Electrical Stimulation;Moist Heat;Therapeutic exercise;Neuromuscular re-education;Patient/family education;Manual techniques;Scar mobilization;Spinal Manipulations;Joint Manipulations;Taping;Therapeutic activities    PT Next Visit Plan abdominal rehabilitation    PT Home Exercise Plan 5x5 PFM    Consulted and Agree with Plan of Care Patient             Patient will benefit from skilled therapeutic intervention in order to improve the following deficits and impairments:  Decreased activity tolerance, Decreased coordination, Decreased endurance, Decreased strength, Improper body mechanics, Postural dysfunction  Visit Diagnosis: Muscle weakness (generalized)  Other lack of coordination     Problem List Patient Active Problem List   Diagnosis Date Noted   Abnormal CT scan 05/04/2019   Rectal abnormality 05/04/2019   Elevated LFTs    Fever 03/16/2016   Sepsis ([place]) 03/16/2016   Dehydration 03/16/2016   Leukopenia 03/16/2016   Thrombocytopenia ([place]) 03/16/2016   Essential hypertension 03/16/2016   Diabetes mellitus type II, non insulin dependent ([place]) 03/16/2016    Myles Gip PT, DPT [phone]  01/22/2021, 1:20 PM  [place] [facility] [address]. [place], [place], [address] Phone: [phone]   Fax:  [phone]  Name: Bradley Hurst MRN: [id number] Date of Birth: [birth year]

## 2021-01-28 ENCOUNTER — Encounter: Payer: 59 | Admitting: Physical Therapy

## 2021-02-09 ENCOUNTER — Emergency Department (HOSPITAL_COMMUNITY): Payer: 59

## 2021-02-09 ENCOUNTER — Other Ambulatory Visit: Payer: Self-pay

## 2021-02-09 ENCOUNTER — Emergency Department (HOSPITAL_COMMUNITY)
Admission: EM | Admit: 2021-02-09 | Discharge: 2021-02-09 | Disposition: A | Discharge status: Home / Self Care | Payer: 59 | Attending: Emergency Medicine | Admitting: Emergency Medicine

## 2021-02-09 ENCOUNTER — Encounter (HOSPITAL_COMMUNITY): Payer: Self-pay | Admitting: Emergency Medicine

## 2021-02-09 DIAGNOSIS — S82201B Unspecified fracture of shaft of right tibia, initial encounter for open fracture type I or II: Secondary | ICD-10-CM

## 2021-02-09 DIAGNOSIS — Z23 Encounter for immunization: Secondary | ICD-10-CM | POA: Diagnosis not present

## 2021-02-09 DIAGNOSIS — E1169 Type 2 diabetes mellitus with other specified complication: Secondary | ICD-10-CM | POA: Insufficient documentation

## 2021-02-09 DIAGNOSIS — W312XXA Contact with powered woodworking and forming machines, initial encounter: Secondary | ICD-10-CM | POA: Diagnosis not present

## 2021-02-09 DIAGNOSIS — Z79899 Other long term (current) drug therapy: Secondary | ICD-10-CM | POA: Diagnosis not present

## 2021-02-09 DIAGNOSIS — T1490XA Injury, unspecified, initial encounter: Secondary | ICD-10-CM

## 2021-02-09 DIAGNOSIS — S8991XA Unspecified injury of right lower leg, initial encounter: Secondary | ICD-10-CM | POA: Diagnosis present

## 2021-02-09 DIAGNOSIS — E785 Hyperlipidemia, unspecified: Secondary | ICD-10-CM | POA: Diagnosis not present

## 2021-02-09 DIAGNOSIS — S81811A Laceration without foreign body, right lower leg, initial encounter: Secondary | ICD-10-CM

## 2021-02-09 DIAGNOSIS — Z7984 Long term (current) use of oral hypoglycemic drugs: Secondary | ICD-10-CM | POA: Diagnosis not present

## 2021-02-09 DIAGNOSIS — I10 Essential (primary) hypertension: Secondary | ICD-10-CM | POA: Diagnosis not present

## 2021-02-09 MED ORDER — LIDOCAINE HCL (PF) 1 % IJ SOLN
INTRAMUSCULAR | Status: AC
  Filled 2021-02-09: qty 30

## 2021-02-09 MED ORDER — CLINDAMYCIN HCL 300 MG PO CAPS: 300.0000 mg | ORAL_CAPSULE | Freq: Four times a day (QID) | ORAL | 0 refills | Status: AC

## 2021-02-09 MED ORDER — LIDOCAINE HCL (PF) 2 % IJ SOLN
INTRAMUSCULAR | Status: AC
  Filled 2021-02-09: qty 40

## 2021-02-09 MED ORDER — MORPHINE SULFATE (PF) 4 MG/ML IV SOLN
4.0000 mg | Freq: Once | INTRAVENOUS | Status: AC
  Administered 2021-02-09: 4 mg via INTRAVENOUS
  Filled 2021-02-09: qty 1

## 2021-02-09 MED ORDER — LIDOCAINE-EPINEPHRINE 2 %-1:100000 IJ SOLN
20.0000 mL | Freq: Once | INTRAMUSCULAR | Status: AC
  Administered 2021-02-09: 20 mL via INTRADERMAL
  Filled 2021-02-09: qty 20

## 2021-02-09 MED ORDER — NAPROXEN 500 MG PO TABS: 500.0000 mg | ORAL_TABLET | Freq: Two times a day (BID) | ORAL | 0 refills | Status: AC

## 2021-02-09 MED ORDER — TETANUS-DIPHTH-ACELL PERTUSSIS 5-2.5-18.5 LF-MCG/0.5 IM SUSY
0.5000 mL | PREFILLED_SYRINGE | Freq: Once | INTRAMUSCULAR | Status: AC
  Administered 2021-02-09: 0.5 mL via INTRAMUSCULAR
  Filled 2021-02-09: qty 0.5

## 2021-02-09 MED ORDER — HYDROCODONE-ACETAMINOPHEN 5-325 MG PO TABS
1.0000 | ORAL_TABLET | Freq: Four times a day (QID) | ORAL | 0 refills | Status: AC | PRN
Start: 2021-02-09 — End: ?

## 2021-02-09 MED ORDER — CEFAZOLIN SODIUM-DEXTROSE 2-4 GM/100ML-% IV SOLN
2.0000 g | INTRAVENOUS | Status: AC
  Administered 2021-02-09: 2 g via INTRAVENOUS
  Filled 2021-02-09: qty 100

## 2021-02-09 NOTE — ED Notes (Signed)
Wound dressed with bulky dressing. Splint applied to leg. Crutches given. Pt ambulatory to car

## 2021-02-09 NOTE — ED Provider Notes (Signed)
[facility] EMERGENCY DEPARTMENT Provider Note   CSN: [id number] Arrival date & time: 02/09/21  2026     History Chief Complaint  Patient presents with   Laceration    Bradley Hurst is a 60 y.o. male.   Laceration  This patient is a 60 year old male, he has a history of hyperlipidemia hypertension and type 2 diabetes.  He presents to the hospital after suffering an accidental injury to his right lower extremity.  He was using a chainsaw which was in a piece of wood, he looked up to see what was going on above him when the chainsaw kicked back and struck him in the right lower extremity causing a laceration.  This went through his close.  This was acute in onset occurred just prior to arrival and is constant, it is worse with palpation, the bleeding has been controlled.  A tourniquet was placed above the wound just prior to arrival.  The patient is not up-to-date on tetanus.  Past Medical History:  Diagnosis Date   Bladder outlet obstruction    BPH (benign prostatic hyperplasia)    Elevated PSA    Feeling of incomplete bladder emptying    Hyperlipidemia    Hypertension    Type 2 diabetes mellitus ([place])    Weak urinary stream     Patient Active Problem List   Diagnosis Date Noted   Abnormal CT scan 05/04/2019   Rectal abnormality 05/04/2019   Elevated LFTs    Fever 03/16/2016   Sepsis ([place]) 03/16/2016   Dehydration 03/16/2016   Leukopenia 03/16/2016   Thrombocytopenia ([place]) 03/16/2016   Essential hypertension 03/16/2016   Diabetes mellitus type II, non insulin dependent ([place]) 03/16/2016    Past Surgical History:  Procedure Laterality Date   CIRCUMCISION  2017   CIRCUMCISION N/A 09/24/2020   Procedure: CIRCUMCISION ADULT;  Surgeon: Hollice Espy, MD;  Location: [facility] ORS;  Service: Urology;  Laterality: N/A;   COLONOSCOPY N/A 04/23/2018   Dr. Gala Romney: 77mm hyperplasitc sigmoid polyp removed. next colonoscopy in 10 years   FLEXIBLE SIGMOIDOSCOPY N/A 07/19/2019   Procedure:  FLEXIBLE SIGMOIDOSCOPY;  Surgeon: Daneil Dolin, MD;  [facility];  Service: Endoscopy;  Laterality: N/A;  8:30am   HOLEP-LASER ENUCLEATION OF THE PROSTATE WITH MORCELLATION N/A 09/24/2020   Procedure: HOLEP-LASER ENUCLEATION OF THE PROSTATE WITH MORCELLATION;  Surgeon: Hollice Espy, MD;  Location: [facility] ORS;  Service: Urology;  Laterality: N/A;   POLYPECTOMY  04/23/2018   Procedure: POLYPECTOMY;  Surgeon: Daneil Dolin, MD;  [facility];  Service: Endoscopy;;       Family History  Problem Relation Age of Onset   Diabetes Mother    Heart attack Father    Ovarian cancer Sister     Social History   Tobacco Use   Smoking status: Never   Smokeless tobacco: Never  Vaping Use   Vaping Use: Never used  Substance Use Topics   Alcohol use: No   Drug use: No    Home Medications Prior to Admission medications   Medication Sig Start Date End Date Taking? Authorizing Provider  acarbose (PRECOSE) 100 MG tablet Take 100 mg by mouth 3 (three) times daily.  04/11/19   [provider]  amLODipine (NORVASC) 10 MG tablet Take 10 mg by mouth at bedtime.     [provider]  calcium carbonate (TUMS - DOSED IN MG ELEMENTAL CALCIUM) 500 MG chewable tablet Chew 1 tablet by mouth daily as needed for indigestion or heartburn.  [provider]  empagliflozin (JARDIANCE) 25 MG TABS tablet Take 25 mg by mouth daily.    [provider]  ezetimibe (ZETIA) 10 MG tablet Take 10 mg by mouth at bedtime.  02/06/18   [provider]  hydrochlorothiazide (MICROZIDE) 12.5 MG capsule Take 12.5 mg by mouth daily. 10/10/19   [provider]  losartan (COZAAR) 100 MG tablet Take 100 mg by mouth daily. 10/10/19   [provider]  metFORMIN (GLUCOPHAGE) 1000 MG tablet Take 1,000 mg by mouth 2 (two) times daily with a meal.    [provider]  metoprolol tartrate (LOPRESSOR) 25 MG tablet Take 25 mg by mouth at bedtime.    [provider]  Potassium Chloride CR (MICRO-K) 8 MEQ CPCR capsule CR Take 8 mEq by mouth at bedtime.    [provider]  pravastatin (PRAVACHOL) 40 MG tablet Take 40 mg by mouth daily. 10/10/19   [provider]  tadalafil (CIALIS) 5 MG tablet Take 5 mg by mouth daily.    [provider]  topiramate (TOPAMAX) 50 MG tablet Take 50 mg by mouth 2 (two) times daily.  02/08/18   [provider]  TRULICITY 8.25 KN/3.9JQ SOPN Inject 0.75 mg into the skin every Friday. 02/03/18   [provider]    Allergies    Patient has no known allergies.  Review of Systems   Review of Systems  All other systems reviewed and are negative.  Physical Exam Updated Vital Signs BP 125/77   Pulse 74   Temp 98.7 F (37.1 C) (Oral)   Resp 18   Ht 1.778 m (5\' 10" )   Wt 112 kg   SpO2 98%   BMI 35.43 kg/m   Physical Exam Vitals and nursing note reviewed.  Constitutional:      General: He is not in acute distress.    Appearance: He is well-developed.  HENT:     Head: Normocephalic and atraumatic.     Mouth/Throat:     Pharynx: No oropharyngeal exudate.  Eyes:     General: No scleral icterus.       Right eye: No discharge.        Left eye: No discharge.     Conjunctiva/sclera: Conjunctivae normal.     Pupils: Pupils are equal, round, and reactive to light.  Neck:     Thyroid: No thyromegaly.     Vascular: No JVD.  Cardiovascular:     Rate and Rhythm: Normal rate and regular rhythm.     Heart sounds: Normal heart sounds. No murmur heard.   No friction rub. No gallop.  Pulmonary:     Effort: Pulmonary effort is normal. No respiratory distress.     Breath sounds: Normal breath sounds. No wheezing or rales.  Abdominal:     General: Bowel sounds are normal. There is no distension.     Palpations: Abdomen is soft. There is no mass.     Tenderness: There is no abdominal tenderness.  Musculoskeletal:        General: No tenderness. Normal range of motion.      Cervical back: Normal range of motion and neck supple.  Lymphadenopathy:     Cervical: No cervical adenopathy.  Skin:    General: Skin is warm and dry.     Findings: No erythema or rash.     Comments: Laceration - 10cm in length - transverse across the RLE mid tibial region - through fascia - exposed muscle present -  there is no bleeding - there is some organic material in the wound.  Neurological:     Mental Status: He is alert.     Coordination: Coordination normal.     Comments: Normal strength and sensation distal to the injury, normal capillary refill and pulses at the feet  Psychiatric:        Behavior: Behavior normal.    ED Results / Procedures / Treatments   Labs (all labs ordered are listed, but only abnormal results are displayed) Labs Reviewed - No data to display  EKG None  Radiology DG Tibia/Fibula Right  Result Date: 02/09/2021 CLINICAL DATA:  Recent change saw injury, initial encounter EXAM: RIGHT TIBIA AND FIBULA - 2 VIEW COMPARISON:  None. FINDINGS: Anterior soft tissue defect is noted consistent with the given clinical history. Small bony fragments are noted within the soft tissue wound. There is a defect in the anterior aspect of the mid tibia consistent with the recent injury. No other fracture is seen. No other focal abnormality is noted. IMPRESSION: Soft tissue and bony injury in the mid right shin consistent with the given clinical history. Bony fragments are noted in the soft tissue wound. Electronically Signed   By: Inez Catalina M.D.   On: 02/09/2021 21:07    Procedures .Marland KitchenLaceration Repair  Date/Time: 02/09/2021 11:27 PM Performed by: Noemi Chapel, MD Authorized by: Noemi Chapel, MD   Consent:    Consent obtained:  Verbal   Consent given by:  Patient   Risks, benefits, and alternatives were discussed: yes     Risks discussed:  Infection, pain, retained foreign body, poor cosmetic result, need for additional repair, nerve damage, poor wound healing,  vascular damage and tendon damage   Alternatives discussed:  No treatment, delayed treatment and referral Universal protocol:    Procedure explained and questions answered to patient or proxy's satisfaction: yes     Relevant documents present and verified: yes     Test results available: yes     Imaging studies available: yes     Required blood products, implants, devices, and special equipment available: yes     Site/side marked: yes     Immediately prior to procedure, a time out was called: yes     Patient identity confirmed:  Verbally with patient Anesthesia:    Anesthesia method:  Local infiltration   Local anesthetic:  Lidocaine 1% WITH epi Laceration details:    Location:  Leg   Leg location:  L lower leg   Length (cm):  10   Depth (mm):  8 Pre-procedure details:    Preparation:  Patient was prepped and draped in usual sterile fashion and imaging obtained to evaluate for foreign bodies Exploration:    Limited defect created (wound extended): no     Hemostasis achieved with:  Direct pressure   Imaging obtained: x-ray     Imaging outcome: foreign body noted     Wound exploration: wound explored through full range of motion and entire depth of wound visualized     Wound extent: fascia violated, foreign bodies/material and underlying fracture     Wound extent: no nerve damage noted     Foreign bodies/material:  Multiple bony fragments and multiple organic material fragments   Contaminated: yes   Treatment:    Area cleansed with:  Povidone-iodine   Amount of cleaning:  Extensive   Irrigation solution:  Sterile saline   Irrigation volume:  3000   Irrigation method:  Pressure wash   Visualized foreign  bodies/material removed: yes     Debridement:  None   Undermining:  None Skin repair:    Repair method:  Sutures   Suture size:  3-0   Suture material:  Prolene   Suture technique:  Horizontal mattress   Number of sutures:  6 Approximation:    Approximation:  Loose Repair  type:    Repair type:  Complex Post-procedure details:    Dressing:  Antibiotic ointment and bulky dressing   Procedure completion:  Tolerated well, no immediate complications Comments:      This wound was deep, in fact it violated the dermis, the fascia, the belly of the muscle in the anterior compartment of the leg as well as the tibial surface.     Medications Ordered in ED Medications  lidocaine-EPINEPHrine (XYLOCAINE W/EPI) 2 %-1:100000 (with pres) injection 20 mL (has no administration in time range)  Tdap (BOOSTRIX) injection 0.5 mL (0.5 mLs Intramuscular Given 02/09/21 2056)  morphine 4 MG/ML injection 4 mg (4 mg Intravenous Given 02/09/21 2056)  ceFAZolin (ANCEF) IVPB 2g/100 mL premix (2 g Intravenous New Bag/Given 02/09/21 2111)    ED Course  I have reviewed the triage vital signs and the nursing notes.  Pertinent labs & imaging results that were available during my care of the patient were reviewed by me and considered in my medical decision making (see chart for details).    MDM Rules/Calculators/A&P                          This appears to be an isolated injury however the patient will be imaged to make sure there is no other foreign bodies visualized that need to be removed, under direct visualization the foreign bodies will need to be cleaned out, the wounds will need to be cleansed and repaired.  The patient is agreeable to the plan, tetanus updated.  I spoke with the orthopedic surgeon, he recommends that the patient have a cleanout here, irrigate well, loose closure, antibiotics and home on clindamycin to follow-up on Monday or Wednesday in the office.  He will have the office contact the patient for follow-up.  This was a very complex wound, it had irregular edges, it required significant irrigation of over 3 L of normal saline, there were multiple organic fragments as well as multiple bony fragments which were irrigated out of the wound.  The wound violated the muscle  belly the fascia and ultimately the surface of the tibial bone which was cracked and broken into several pieces on the anterior surface.  At this time the patient has been informed of the plan he is in total agreement to take antibiotics stay nonweightbearing with a posterior splint in place, he will walk with crutches and nonweightbearing until he follows up, he understands indications for return and will be sent with both pain medicine and clindamycin.  Final Clinical Impression(s) / ED Diagnoses Final diagnoses:  Type I or II open fracture of shaft of right tibia, unspecified fracture morphology, initial encounter  Laceration of right lower extremity, initial encounter      Noemi Chapel, MD 02/09/21 2336

## 2021-02-09 NOTE — ED Triage Notes (Signed)
Pt arrives with laceration from chain saw to right lower leg. Bleeding controlled at this time.

## 2021-02-09 NOTE — Discharge Instructions (Addendum)
There are 6 stitches in your leg, the stitches need to stay in place until the orthopedic surgeon takes them out.  Please do not get this laceration wet, keep it clean at all times, change the dressing twice a day.  Take clindamycin as prescribed 4 times a day, I have also prescribed 2 pain medications including naproxen and hydrocodone.  Return to the emergency department for severe worsening pain swelling fevers spreading redness pus from the wound or any other worsening symptoms.

## 2021-02-11 ENCOUNTER — Encounter: Payer: 59 | Admitting: Physical Therapy

## 2021-02-18 ENCOUNTER — Encounter: Payer: 59 | Admitting: Physical Therapy

## 2021-02-25 ENCOUNTER — Encounter: Payer: 59 | Admitting: Physical Therapy

## 2021-05-07 NOTE — Progress Notes (Incomplete)
05/07/21 4:21 PM   [name] 09/02/1960 [id number]  Referring provider:  [name], MD [address] [place],  [place] [address] No chief complaint on file.    HPI: Bradley Hurst is a 60 y.o.male with a personal history of BPH with urinary obstruction, stress urinary incontinence, and phimosis, who presents today for 6 month follow-up with IPSS and PVR.   He underwent a prostate biopsy. Surgical pathology revealed benign prostate tissue, which was 28.75 g.   He is s/p circumcision and HoLEP.   IPSS***    PMH: Past Medical History:  Diagnosis Date   Bladder outlet obstruction    BPH (benign prostatic hyperplasia)    Elevated PSA    Feeling of incomplete bladder emptying    Hyperlipidemia    Hypertension    Type 2 diabetes mellitus ([place])    Weak urinary stream     Surgical History: Past Surgical History:  Procedure Laterality Date   CIRCUMCISION  2017   CIRCUMCISION N/A 09/24/2020   Procedure: CIRCUMCISION ADULT;  Surgeon: Hollice Espy, MD;  Location: [facility];  Service: Urology;  Laterality: N/A;   COLONOSCOPY N/A 04/23/2018   Dr. Gala Romney: 27mm hyperplasitc sigmoid polyp removed. next colonoscopy in 10 years   FLEXIBLE SIGMOIDOSCOPY N/A 07/19/2019   Procedure: FLEXIBLE SIGMOIDOSCOPY;  Surgeon: Daneil Dolin, MD;  Location: [facility];  Service: Endoscopy;  Laterality: N/A;  8:30am   HOLEP-LASER ENUCLEATION OF THE PROSTATE WITH MORCELLATION N/A 09/24/2020   Procedure: HOLEP-LASER ENUCLEATION OF THE PROSTATE WITH MORCELLATION;  Surgeon: Hollice Espy, MD;  Location: [facility];  Service: Urology;  Laterality: N/A;   POLYPECTOMY  04/23/2018   Procedure: POLYPECTOMY;  Surgeon: Daneil Dolin, MD;  Location: [facility];  Service: Endoscopy;;    Home Medications:  Allergies as of 05/08/2021   No Known Allergies      Medication List        Accurate as of May 07, 2021  4:21 PM. If you have any questions, ask your nurse or doctor.           acarbose 100 MG tablet Commonly known as: PRECOSE Take 100 mg by mouth 3 (three) times daily.   amLODipine 10 MG tablet Commonly known as: NORVASC Take 10 mg by mouth at bedtime.   calcium carbonate 500 MG chewable tablet Commonly known as: TUMS - dosed in mg elemental calcium Chew 1 tablet by mouth daily as needed for indigestion or heartburn.   empagliflozin 25 MG Tabs tablet Commonly known as: JARDIANCE Take 25 mg by mouth daily.   ezetimibe 10 MG tablet Commonly known as: ZETIA Take 10 mg by mouth at bedtime.   hydrochlorothiazide 12.5 MG capsule Commonly known as: MICROZIDE Take 12.5 mg by mouth daily.   HYDROcodone-acetaminophen 5-325 MG tablet Commonly known as: NORCO/VICODIN Take 1 tablet by mouth every 6 (six) hours as needed.   losartan 100 MG tablet Commonly known as: COZAAR Take 100 mg by mouth daily.   metFORMIN 1000 MG tablet Commonly known as: GLUCOPHAGE Take 1,000 mg by mouth 2 (two) times daily with a meal.   metoprolol tartrate 25 MG tablet Commonly known as: LOPRESSOR Take 25 mg by mouth at bedtime.   naproxen 500 MG tablet Commonly known as: Naprosyn Take 1 tablet (500 mg total) by mouth 2 (two) times daily with a meal.   Potassium Chloride CR 8 MEQ Cpcr capsule CR Commonly known as: MICRO-K Take 8 mEq by mouth at bedtime.   pravastatin  40 MG tablet Commonly known as: PRAVACHOL Take 40 mg by mouth daily.   tadalafil 5 MG tablet Commonly known as: CIALIS Take 5 mg by mouth daily.   topiramate 50 MG tablet Commonly known as: TOPAMAX Take 50 mg by mouth 2 (two) times daily.   Trulicity 9.44 HQ/7.5FF Sopn Generic drug: Dulaglutide Inject 0.75 mg into the skin every Friday.        Allergies: No Known Allergies  Family History: Family History  Problem Relation Age of Onset   Diabetes Mother    Heart attack Father    Ovarian cancer Sister     Social History:  reports that he has never smoked. He has never used smokeless  tobacco. He reports that he does not drink alcohol and does not use drugs.   Physical Exam: There were no vitals taken for this visit.  Constitutional:  Alert and oriented, No acute distress. HEENT: [place] AT, moist mucus membranes.  Trachea midline, no masses. Cardiovascular: No clubbing, cyanosis, or edema. Respiratory: Normal respiratory effort, no increased work of breathing. Skin: No rashes, bruises or suspicious lesions. Neurologic: Grossly intact, no focal deficits, moving all 4 extremities. Psychiatric: Normal mood and affect.  Laboratory Data:  Lab Results  Component Value Date   CREATININE 0.93 09/11/2020    Lab Results  Component Value Date   HGBA1C 7.7 (H) 03/17/2016    Urinalysis   Pertinent Imaging: PVR***   Assessment & Plan:     No follow-ups on file. I,Kailey Littlejohn,acting as a Education administrator for Hollice Espy, MD.,have documented all relevant documentation on the behalf of Hollice Espy, MD,as directed by  Hollice Espy, MD while in the presence of Hollice Espy, [place] [address] [place], [place] [id number] [phone]

## 2021-05-08 ENCOUNTER — Ambulatory Visit: Payer: 59 | Admitting: Urology

## 2021-05-10 ENCOUNTER — Encounter: Payer: Self-pay | Admitting: Urology
